# Patient Record
Sex: Male | Born: 1955 | Hispanic: No | Marital: Married
Health system: Southern US, Community
[De-identification: ages and names within clinical notes are randomized; demographics above are authoritative.]

## PROBLEM LIST (undated history)

## (undated) DIAGNOSIS — J449 Chronic obstructive pulmonary disease, unspecified: Secondary | ICD-10-CM

## (undated) DIAGNOSIS — J9621 Acute and chronic respiratory failure with hypoxia: Secondary | ICD-10-CM

## (undated) DIAGNOSIS — N183 Chronic kidney disease, stage 3 unspecified: Secondary | ICD-10-CM

## (undated) DIAGNOSIS — J80 Acute respiratory distress syndrome: Secondary | ICD-10-CM

## (undated) DIAGNOSIS — U071 COVID-19: Secondary | ICD-10-CM

---

## 2019-11-26 ENCOUNTER — Other Ambulatory Visit (HOSPITAL_COMMUNITY): Payer: Medicare Other

## 2019-11-26 ENCOUNTER — Inpatient Hospital Stay
Admission: EM | Admit: 2019-11-26 | Discharge: 2019-12-30 | Disposition: A | Discharge status: Skilled Nursing Facility | Payer: Medicare Other | Source: Other Acute Inpatient Hospital | Attending: Internal Medicine | Admitting: Internal Medicine

## 2019-11-26 DIAGNOSIS — J80 Acute respiratory distress syndrome: Secondary | ICD-10-CM | POA: Diagnosis present

## 2019-11-26 DIAGNOSIS — J189 Pneumonia, unspecified organism: Secondary | ICD-10-CM

## 2019-11-26 DIAGNOSIS — U071 COVID-19: Secondary | ICD-10-CM | POA: Diagnosis present

## 2019-11-26 DIAGNOSIS — N183 Chronic kidney disease, stage 3 unspecified: Secondary | ICD-10-CM | POA: Diagnosis present

## 2019-11-26 DIAGNOSIS — Z931 Gastrostomy status: Secondary | ICD-10-CM

## 2019-11-26 DIAGNOSIS — J9690 Respiratory failure, unspecified, unspecified whether with hypoxia or hypercapnia: Secondary | ICD-10-CM

## 2019-11-26 DIAGNOSIS — T17908A Unspecified foreign body in respiratory tract, part unspecified causing other injury, initial encounter: Secondary | ICD-10-CM

## 2019-11-26 DIAGNOSIS — N179 Acute kidney failure, unspecified: Secondary | ICD-10-CM

## 2019-11-26 DIAGNOSIS — J9621 Acute and chronic respiratory failure with hypoxia: Secondary | ICD-10-CM | POA: Diagnosis present

## 2019-11-26 DIAGNOSIS — K567 Ileus, unspecified: Secondary | ICD-10-CM

## 2019-11-26 DIAGNOSIS — J449 Chronic obstructive pulmonary disease, unspecified: Secondary | ICD-10-CM | POA: Diagnosis present

## 2019-11-26 HISTORY — DX: COVID-19: U07.1

## 2019-11-26 HISTORY — DX: Chronic kidney disease, stage 3 unspecified: N18.30

## 2019-11-26 HISTORY — DX: Acute and chronic respiratory failure with hypoxia: J96.21

## 2019-11-26 HISTORY — DX: Chronic obstructive pulmonary disease, unspecified: J44.9

## 2019-11-26 HISTORY — DX: Acute respiratory distress syndrome: J80

## 2019-11-27 ENCOUNTER — Encounter: Payer: Self-pay | Admitting: Internal Medicine

## 2019-11-27 DIAGNOSIS — J9621 Acute and chronic respiratory failure with hypoxia: Secondary | ICD-10-CM | POA: Diagnosis not present

## 2019-11-27 DIAGNOSIS — U071 COVID-19: Secondary | ICD-10-CM

## 2019-11-27 DIAGNOSIS — J449 Chronic obstructive pulmonary disease, unspecified: Secondary | ICD-10-CM | POA: Diagnosis not present

## 2019-11-27 DIAGNOSIS — J80 Acute respiratory distress syndrome: Secondary | ICD-10-CM

## 2019-11-27 DIAGNOSIS — N183 Chronic kidney disease, stage 3 unspecified: Secondary | ICD-10-CM | POA: Diagnosis not present

## 2019-11-27 LAB — HEMOGLOBIN A1C
Hgb A1c MFr Bld: 6.3 % — ABNORMAL HIGH (ref 4.8–5.6)
Mean Plasma Glucose: 134.11 mg/dL

## 2019-11-27 LAB — URINALYSIS, ROUTINE W REFLEX MICROSCOPIC
Bilirubin Urine: NEGATIVE
Glucose, UA: NEGATIVE mg/dL
Ketones, ur: NEGATIVE mg/dL
Leukocytes,Ua: NEGATIVE
Nitrite: NEGATIVE
Protein, ur: 30 mg/dL — AB
Specific Gravity, Urine: 1.021 (ref 1.005–1.030)
pH: 5 (ref 5.0–8.0)

## 2019-11-27 LAB — CBC WITH DIFFERENTIAL/PLATELET
Abs Immature Granulocytes: 0.08 10*3/uL — ABNORMAL HIGH (ref 0.00–0.07)
Basophils Absolute: 0 10*3/uL (ref 0.0–0.1)
Basophils Relative: 0 %
Eosinophils Absolute: 0.1 10*3/uL (ref 0.0–0.5)
Eosinophils Relative: 1 %
HCT: 40.5 % (ref 39.0–52.0)
Hemoglobin: 12.8 g/dL — ABNORMAL LOW (ref 13.0–17.0)
Immature Granulocytes: 1 %
Lymphocytes Relative: 10 %
Lymphs Abs: 1.2 10*3/uL (ref 0.7–4.0)
MCH: 28.6 pg (ref 26.0–34.0)
MCHC: 31.6 g/dL (ref 30.0–36.0)
MCV: 90.6 fL (ref 80.0–100.0)
Monocytes Absolute: 1.4 10*3/uL — ABNORMAL HIGH (ref 0.1–1.0)
Monocytes Relative: 13 %
Neutro Abs: 8.5 10*3/uL — ABNORMAL HIGH (ref 1.7–7.7)
Neutrophils Relative %: 75 %
Platelets: 368 10*3/uL (ref 150–400)
RBC: 4.47 MIL/uL (ref 4.22–5.81)
RDW: 17.1 % — ABNORMAL HIGH (ref 11.5–15.5)
WBC: 11.4 10*3/uL — ABNORMAL HIGH (ref 4.0–10.5)
nRBC: 0 % (ref 0.0–0.2)

## 2019-11-27 LAB — COMPREHENSIVE METABOLIC PANEL
ALT: 65 U/L — ABNORMAL HIGH (ref 0–44)
AST: 44 U/L — ABNORMAL HIGH (ref 15–41)
Albumin: 2.5 g/dL — ABNORMAL LOW (ref 3.5–5.0)
Alkaline Phosphatase: 62 U/L (ref 38–126)
Anion gap: 11 (ref 5–15)
BUN: 24 mg/dL — ABNORMAL HIGH (ref 8–23)
CO2: 22 mmol/L (ref 22–32)
Calcium: 8.9 mg/dL (ref 8.9–10.3)
Chloride: 116 mmol/L — ABNORMAL HIGH (ref 98–111)
Creatinine, Ser: 1.06 mg/dL (ref 0.61–1.24)
GFR calc Af Amer: >60 mL/min (ref >60)
GFR calc non Af Amer: >60 mL/min (ref >60)
Glucose, Bld: 134 mg/dL — ABNORMAL HIGH (ref 70–99)
Potassium: 4.4 mmol/L (ref 3.5–5.1)
Sodium: 149 mmol/L — ABNORMAL HIGH (ref 135–145)
Total Bilirubin: 0.8 mg/dL (ref 0.3–1.2)
Total Protein: 6.5 g/dL (ref 6.5–8.1)

## 2019-11-27 LAB — BLOOD GAS, ARTERIAL
Acid-base deficit: 3.5 mmol/L — ABNORMAL HIGH (ref 0.0–2.0)
Bicarbonate: 20.5 mmol/L (ref 20.0–28.0)
FIO2: 36
O2 Saturation: 96.7 %
Patient temperature: 38.7
pCO2 arterial: 36.9 mmHg (ref 32.0–48.0)
pH, Arterial: 7.372 (ref 7.350–7.450)
pO2, Arterial: 107 mmHg (ref 83.0–108.0)

## 2019-11-27 MED ORDER — LIDOCAINE HCL 2 % EX GEL
10.00 | CUTANEOUS | Status: DC
Start: ? — End: 2019-11-27

## 2019-11-27 MED ORDER — ACETAMINOPHEN 325 MG PO TABS
650.00 | ORAL_TABLET | ORAL | Status: DC
Start: ? — End: 2019-11-27

## 2019-11-27 MED ORDER — PROSOURCE TF PO LIQD
90.00 | ORAL | Status: DC
Start: 2019-11-26 — End: 2019-11-27

## 2019-11-27 MED ORDER — MAGNESIUM SULFATE 4 GM/100ML IV SOLN
4.00 | INTRAVENOUS | Status: DC
Start: ? — End: 2019-11-27

## 2019-11-27 MED ORDER — GENERIC EXTERNAL MEDICATION
1.00 | Status: DC
Start: ? — End: 2019-11-27

## 2019-11-27 MED ORDER — ONDANSETRON HCL 4 MG/2ML IJ SOLN
4.00 | INTRAMUSCULAR | Status: DC
Start: ? — End: 2019-11-27

## 2019-11-27 MED ORDER — LORAZEPAM 2 MG/ML IJ SOLN
1.00 | INTRAMUSCULAR | Status: DC
Start: ? — End: 2019-11-27

## 2019-11-27 MED ORDER — GENERIC EXTERNAL MEDICATION
Status: DC
Start: ? — End: 2019-11-27

## 2019-11-27 MED ORDER — ATROPINE SULFATE 0.5 MG/5ML IJ SOSY
0.50 | PREFILLED_SYRINGE | INTRAMUSCULAR | Status: DC
Start: ? — End: 2019-11-27

## 2019-11-27 MED ORDER — EPINEPHRINE 1 MG/10ML IJ SOSY
1.00 | PREFILLED_SYRINGE | INTRAMUSCULAR | Status: DC
Start: ? — End: 2019-11-27

## 2019-11-27 MED ORDER — GENERIC EXTERNAL MEDICATION
5.00 | Status: DC
Start: ? — End: 2019-11-27

## 2019-11-27 MED ORDER — INSULIN ASPART 100 UNIT/ML FLEXPEN
0.00 | PEN_INJECTOR | SUBCUTANEOUS | Status: DC
Start: 2019-11-26 — End: 2019-11-27

## 2019-11-27 MED ORDER — IPRATROPIUM-ALBUTEROL 0.5-2.5 (3) MG/3ML IN SOLN
3.00 | RESPIRATORY_TRACT | Status: DC
Start: ? — End: 2019-11-27

## 2019-11-27 MED ORDER — FUROSEMIDE 10 MG/ML IJ SOLN
20.00 | INTRAMUSCULAR | Status: DC
Start: 2019-11-27 — End: 2019-11-27

## 2019-11-27 MED ORDER — SODIUM CHLORIDE 0.9 % IV SOLN
INTRAVENOUS | Status: DC
Start: ? — End: 2019-11-27

## 2019-11-27 MED ORDER — FENTANYL CITRATE (PF) 50 MCG/ML IJ SOLN
50.00 | INTRAMUSCULAR | Status: DC
Start: ? — End: 2019-11-27

## 2019-11-27 MED ORDER — L-LYSINE EX OINT
TOPICAL_OINTMENT | CUTANEOUS | Status: DC
Start: ? — End: 2019-11-27

## 2019-11-27 MED ORDER — NALOXONE HCL 0.4 MG/ML IJ SOLN
0.04 | INTRAMUSCULAR | Status: DC
Start: ? — End: 2019-11-27

## 2019-11-27 MED ORDER — ZIPRASIDONE HCL 20 MG PO CAPS
20.00 | ORAL_CAPSULE | ORAL | Status: DC
Start: 2019-11-26 — End: 2019-11-27

## 2019-11-27 MED ORDER — FAMOTIDINE 20 MG/2ML IV SOLN
20.00 | INTRAVENOUS | Status: DC
Start: 2019-11-26 — End: 2019-11-27

## 2019-11-27 MED ORDER — CARBOXYMETHYLCELLULOSE SODIUM 0.5 % OP SOLN
2.00 | OPHTHALMIC | Status: DC
Start: 2019-11-26 — End: 2019-11-27

## 2019-11-27 MED ORDER — RA PROBIOTIC DIGESTIVE CARE PO CAPS
1.00 | ORAL_CAPSULE | ORAL | Status: DC
Start: 2019-11-26 — End: 2019-11-27

## 2019-11-27 MED ORDER — LACTATED RINGERS IV SOLN
500.00 | INTRAVENOUS | Status: DC
Start: ? — End: 2019-11-27

## 2019-11-27 MED ORDER — ERGOCALCIFEROL 200 MCG/ML PO SOLN
1000.00 | ORAL | Status: DC
Start: 2019-11-27 — End: 2019-11-27

## 2019-11-27 MED ORDER — BIAFINE EX EMUL
CUTANEOUS | Status: DC
Start: 2019-11-26 — End: 2019-11-27

## 2019-11-27 MED ORDER — ENOXAPARIN SODIUM 300 MG/3ML IJ SOLN
1.00 | INTRAMUSCULAR | Status: DC
Start: 2019-11-26 — End: 2019-11-27

## 2019-11-27 MED ORDER — GENERIC EXTERNAL MEDICATION
4000.00 | Status: DC
Start: ? — End: 2019-11-27

## 2019-11-27 MED ORDER — GLUCOSE-VITAMIN C 4-6 GM-MG PO CHEW
12.00 | CHEWABLE_TABLET | ORAL | Status: DC
Start: ? — End: 2019-11-27

## 2019-11-27 MED ORDER — DEXTROSE 50 % IV SOLN
25.00 | INTRAVENOUS | Status: DC
Start: ? — End: 2019-11-27

## 2019-11-27 MED ORDER — LABETALOL HCL 5 MG/ML IV SOLN
10.00 | INTRAVENOUS | Status: DC
Start: ? — End: 2019-11-27

## 2019-11-27 MED ORDER — INSULIN GLARGINE 100 UNIT/ML SOLOSTAR PEN
15.00 | PEN_INJECTOR | SUBCUTANEOUS | Status: DC
Start: 2019-11-26 — End: 2019-11-27

## 2019-11-27 MED ORDER — NYSTATIN 100000 UNIT/GM EX POWD
CUTANEOUS | Status: DC
Start: 2019-11-26 — End: 2019-11-27

## 2019-11-27 NOTE — Consult Note (Signed)
Currently was off the ventilatorPulmonary Critical Care Medicine [facility] [place]  PULMONARY SERVICE  Date of Service: 11/27/2019  PULMONARY CRITICAL CARE CONSULT   Charles Peters.  HKV:[id number]  DOB: 01-24-1956   DOA: 11/26/2019  Referring Physician: Carron Curie, MD  HPI: Charles Peters. is a 63 y.o. male seen for follow up of Acute on Chronic Respiratory Failure.  Patient has multiple medical problems including COPD coronary artery disease chronic pain GERD chronic bronchitis obstructive sleep apnea myocardial infarction pneumonia who was admitted to the hospital because of increasing shortness of breath.  Patient apparently presented to the ED with a 1 week history of shortness of breath was found to be exposed to a family member who was Covid positive.  There was some myalgias and low-grade fevers noted.  Patient on presentation had a chest x-ray which revealed bilateral infiltrates with significantly hypoxic but was started on BiPAP with some gradual improvement subsequently the patient did decompensate and ended up having to be intubated on the ventilator.  Review of Systems:  ROS performed and is unremarkable other than noted above.  Past Medical History:  Diagnosis Date  . Arthritis  osteo  . CAD (coronary artery disease)  Managed by Dr. Antonieta Loveless [facility]  . Chronic pain  . COPD (chronic obstructive pulmonary disease) (HCC)  Managed by Dr. Nadara Eaton [facility]  . CRI (chronic renal insufficiency)  Stage 3, not on dialysis, Managed by Dr. Clydene Fake [facility]ph[facility]  . Diabetes mellitus (HCC) 2015  Type 2, diet controlled  . Enlarged prostate  . GERD (gastroesophageal reflux disease)  . Hx of cardiac murmur  . Hx of cardiovascular stress test  . Hx of chronic bronchitis  uses inhalers  . Hyperlipidemia  . Hypertension  . MI (myocardial infarction) (HCC) 10/2006  . MRSA (methicillin  resistant Staphylococcus aureus)  . Myocardial infarct (HCC) 2007  . Obstructive sleep apnea  USES CPAP + OXYGEN 2L [place]  . Parathyroid abnormality (HCC)  "readings are high"  . PUD (peptic ulcer disease)  . Thromboembolism (HCC) 1987  DVT left leg, not on coumadin   Past Surgical History:  Procedure Laterality Date  . HX ADENOIDECTOMY  . HX CAROTID ENDARTERECTOMY Right  . HX CARPAL TUNNEL RELEASE Bilateral  . HX CHOLECYSTECTOMY  . HX COLONOSCOPY  . HX CORONARY ARTERY BYPASS GRAFT 2007  triple bypass  . HX HERNIA REPAIR  umbilical  . HX HIP REPLACEMENT Right 07/26/2016  . HX HIP REPLACEMENT Left 11/15/2016  . HX KNEE ARTHROSCOPY Left  x 3  . HX MULTIPLE TOOTH EXTRACTIONS  . HX NERVE TRANSPORTATION Bilateral  ulnar  . HX NISSEN FUNDOPLICATION  . HX POLYPECTOMY 07/31/2019  bladder  . HX PTCA 2008  X 3  . HX TONSILLECTOMY  . HX ULNAR NERVE TRANSPOSITION Bilateral  . HX UPPER ENDOSCOPY  . PR ARTHRODESIS METACARPOPHALANGEAL JT W/WO INT FIXJ Right 09/18/2014  METATARSOPHALANGEAL JOINT FUSION performed by Morton Amy., MD at [facility]  . PR ARTHRODESIS MTCRPL JT W/WO INT FIXJ W/AUTOGRAFT Right 05/25/2015  RIGHT NONUNION REPAIR INDEX MCD FUSION W/ DISTAL RADIUS BONE GRAFT (GENERAL W/ BLOCK) performed by Morton Amy., MD at [facility]  . PR EXC LESION TDN SHTH/JT CAPSL HAND/FNGR Right 09/18/2014  FINGER(S) MUCOUS CYST EXCISION THUMB performed by Morton Amy., MD at [facility]  . PR TENDON SHEATH INCISION Right 09/18/2014  TRIGGER FINGER RELEASE INDEX MCP FUSION / WITH INJECTION FINGER JOINT  performed by Morton AmyWoodside, Julie C., [facility]   Family History  Problem Relation Name Age of Onset  . Heart Disease Father  . Heart Disease Mother  . Diabetes Neg Hx   Social History   Socioeconomic History  . Marital status: Married  Spouse name: Not on file  . Number of children: Not on file  . Years of education: Not on file  . Highest education level: Not on file   Occupational History  . Not on file  Social Needs  . Financial resource strain: Not on file  . Food insecurity  Worry: Not on file  Inability: Not on file  . Transportation needs  Medical: Not on file  Non-medical: Not on file  Tobacco Use  . Smoking status: Former Smoker    Medications: Reviewed on Rounds  Physical Exam:  Vitals: Temperature 101.6 pulse 145 respiratory rate 42 blood pressure 157/81 saturations 99%  Ventilator Settings on 3 L oxygen with a nag device in place  . General: Comfortable at this time . Eyes: Grossly normal lids, irises & conjunctiva . ENT: grossly tongue is normal . Neck: no obvious mass . Cardiovascular: S1-S2 normal no gallop or rub is noted . Respiratory: No rhonchi coarse breath sounds . Abdomen: Soft and nontender . Skin: no rash seen on limited exam . Musculoskeletal: not rigid . Psychiatric:unable to assess . Neurologic: no seizure no involuntary movements         Labs on Admission:  Basic Metabolic Panel: Recent Labs  Lab 11/27/19 0519  NA 149*  K 4.4  CL 116*  CO2 22  GLUCOSE 134*  BUN 24*  CREATININE 1.06  CALCIUM 8.9    Recent Labs  Lab 11/27/19 0550  PHART 7.372  PCO2ART 36.9  PO2ART 107  HCO3 20.5  O2SAT 96.7    Liver Function Tests: Recent Labs  Lab 11/27/19 0519  AST 44*  ALT 65*  ALKPHOS 62  BILITOT 0.8  PROT 6.5  ALBUMIN 2.5*   No results for input(s): LIPASE, AMYLASE in the last 168 hours. No results for input(s): AMMONIA in the last 168 hours.  CBC: Recent Labs  Lab 11/27/19 0519  WBC 11.4*  NEUTROABS 8.5*  HGB 12.8*  HCT 40.5  MCV 90.6  PLT 368    Cardiac Enzymes: No results for input(s): CKTOTAL, CKMB, CKMBINDEX, TROPONINI in the last 168 hours.  BNP (last 3 results) No results for input(s): BNP in the last 8760 hours.  ProBNP (last 3 results) No results for input(s): PROBNP in the last 8760 hours.   Radiological Exams on Admission: DG Abd 1 View  Result Date:  11/26/2019 CLINICAL DATA:  Peg tube check EXAM: ABDOMEN - 1 VIEW COMPARISON:  None. FINDINGS: Contrast medium is present in the percutaneous gastrostomy tube and in the stomach and descending duodenum. No abnormal leakage of contrast around the tube. Gas is present in colon and small bowel. IMPRESSION: 1. The PEG tube is located in the stomach. The stomach and descending duodenum are opacified with contrast. Electronically Signed   By: Gaylyn RongWalter  Liebkemann M.D.   On: 11/26/2019 19:34   DG Chest Port 1 View  Result Date: 11/26/2019 CLINICAL DATA:  ARDS EXAM: PORTABLE CHEST 1 VIEW COMPARISON:  None. FINDINGS: Tracheostomy tube projects over the tracheal air shadow. Reverse lordotic projection. Prior median sternotomy. Low lung volumes are present, causing crowding of the pulmonary vasculature. Equivocal interstitial accentuation at the left lung base but no discrete airspace opacity. No definite blunting of  the costophrenic angles. Left heart border indistinct, borderline appearance for cardiomegaly. Right central line tip: SVC. IMPRESSION: 1. Equivocal interstitial accentuation at the left lung base. 2. Tracheostomy tube appears satisfactorily positioned. 3. Borderline cardiomegaly. 4. Low lung volumes. 5. Prior median sternotomy. 6. Right central line tip: SVC. Electronically Signed   By: Van Clines M.D.   On: 11/26/2019 19:33    Assessment/Plan Active Problems:   Acute on chronic respiratory failure with hypoxia (HCC)   COVID-19 virus infection   Acute respiratory distress syndrome (ARDS) due to 2019 novel coronavirus (HCC)   COPD, severe (HCC)   Chronic kidney disease, stage III (moderate)   1. Acute on chronic respiratory failure with anoxia patient right now is rather tachypneic also is febrile I am concerned about his respiratory rate that is in the 40s along with the fever.  Patient also is noted to be significantly tachycardic.  The chest x-ray that was done shows some cardiomegaly low  lung volumes and some increased interstitial markings which could be consistent as a result of the Covid infection.  Patient did have ARDS at the other facility and this could represent some resolution of that. 2. COVID-19 virus infection patient comes to [place] still with the is in resolution phase.  Spoke with the primary team regarding ongoing isolation. 3. ARDS secondary to COVID-19 still has significant increased work of breathing patient may actually need to be placed back on the ventilator we will see how he does for the remainder of the day. 4. COPD nebulizers as necessary we will continue to monitor closely. 5. Stage III chronic kidney disease follow-up labs closely.  I have personally seen and evaluated the patient, evaluated laboratory and imaging results, formulated the assessment and plan and placed orders.  Patient is critically ill in danger of cardiac arrest and death may need to be placed back on ventilator The Patient requires high complexity decision making for assessment and support.  Case was discussed on Rounds with the Respiratory Therapy Staff Time Spent 62minutes  Allyne Gee, MD [facility] Pulmonary Critical Care Medicine Sleep Medicine

## 2019-11-28 ENCOUNTER — Other Ambulatory Visit (HOSPITAL_COMMUNITY): Payer: Medicare Other

## 2019-11-28 DIAGNOSIS — N183 Chronic kidney disease, stage 3 unspecified: Secondary | ICD-10-CM | POA: Diagnosis not present

## 2019-11-28 DIAGNOSIS — U071 COVID-19: Secondary | ICD-10-CM | POA: Diagnosis not present

## 2019-11-28 DIAGNOSIS — J9621 Acute and chronic respiratory failure with hypoxia: Secondary | ICD-10-CM | POA: Diagnosis not present

## 2019-11-28 DIAGNOSIS — J449 Chronic obstructive pulmonary disease, unspecified: Secondary | ICD-10-CM | POA: Diagnosis not present

## 2019-11-28 LAB — URINE CULTURE
Culture: NO GROWTH
Special Requests: NORMAL

## 2019-11-28 LAB — BLOOD GAS, ARTERIAL
Acid-base deficit: 5.4 mmol/L — ABNORMAL HIGH (ref 0.0–2.0)
Bicarbonate: 18.2 mmol/L — ABNORMAL LOW (ref 20.0–28.0)
FIO2: 28
O2 Saturation: 96.9 %
Patient temperature: 36.6
pCO2 arterial: 27.7 mmHg — ABNORMAL LOW (ref 32.0–48.0)
pH, Arterial: 7.431 (ref 7.350–7.450)
pO2, Arterial: 90.7 mmHg (ref 83.0–108.0)

## 2019-11-28 MED ORDER — GENERIC EXTERNAL MEDICATION
Status: DC
Start: ? — End: 2019-11-28

## 2019-11-28 NOTE — Progress Notes (Signed)
Pulmonary Critical Care Medicine [place]   PULMONARY CRITICAL CARE SERVICE  PROGRESS NOTE  Date of Service: 11/28/2019  Charles Peters.  TDV:[id number]  DOB: 1956-08-04   DOA: 11/26/2019  Referring Physician: Merton Border, MD  HPI: Charles Peters. is a 63 y.o. male seen for follow up of Acute on Chronic Respiratory Failure.  Patient currently is back on the ventilator had to be placed back on the vent yesterday now is on 28% FiO2 on pressure support mode  Medications: Reviewed on Rounds  Physical Exam:  Vitals: Temperature 99.3 pulse 130 respiratory 29 blood pressure 167/86 saturations 98%  Ventilator Settings mode ventilation pressure support FiO2 28% per support 12 PEEP 5  . General: Comfortable at this time . Eyes: Grossly normal lids, irises & conjunctiva . ENT: grossly tongue is normal . Neck: no obvious mass . Cardiovascular: S1 S2 normal no gallop . Respiratory: No rhonchi coarse breath sounds are noted at this time . Abdomen: soft . Skin: no rash seen on limited exam . Musculoskeletal: not rigid . Psychiatric:unable to assess . Neurologic: no seizure no involuntary movements         Lab Data:   Basic Metabolic Panel: Recent Labs  Lab 11/27/19 0519  NA 149*  K 4.4  CL 116*  CO2 22  GLUCOSE 134*  BUN 24*  CREATININE 1.06  CALCIUM 8.9    ABG: Recent Labs  Lab 11/27/19 0550 11/28/19 0537  PHART 7.372 7.431  PCO2ART 36.9 27.7*  PO2ART 107 90.7  HCO3 20.5 18.2*  O2SAT 96.7 96.9    Liver Function Tests: Recent Labs  Lab 11/27/19 0519  AST 44*  ALT 65*  ALKPHOS 62  BILITOT 0.8  PROT 6.5  ALBUMIN 2.5*   No results for input(s): LIPASE, AMYLASE in the last 168 hours. No results for input(s): AMMONIA in the last 168 hours.  CBC: Recent Labs  Lab 11/27/19 0519  WBC 11.4*  NEUTROABS 8.5*  HGB 12.8*  HCT 40.5  MCV 90.6  PLT 368    Cardiac Enzymes: No results for input(s): CKTOTAL, CKMB, CKMBINDEX,  TROPONINI in the last 168 hours.  BNP (last 3 results) No results for input(s): BNP in the last 8760 hours.  ProBNP (last 3 results) No results for input(s): PROBNP in the last 8760 hours.  Radiological Exams: DG Abd 1 View  Result Date: 11/26/2019 CLINICAL DATA:  Peg tube check EXAM: ABDOMEN - 1 VIEW COMPARISON:  None. FINDINGS: Contrast medium is present in the percutaneous gastrostomy tube and in the stomach and descending duodenum. No abnormal leakage of contrast around the tube. Gas is present in colon and small bowel. IMPRESSION: 1. The PEG tube is located in the stomach. The stomach and descending duodenum are opacified with contrast. Electronically Signed   By: Van Clines M.D.   On: 11/26/2019 19:34   DG CHEST PORT 1 VIEW  Result Date: 11/28/2019 CLINICAL DATA:  Respiratory failure. EXAM: PORTABLE CHEST 1 VIEW COMPARISON:  11/26/2019 FINDINGS: The tracheostomy tube is stable. The right PICC line has been removed. Stable borderline cardiac size. Persistent diffuse interstitial process but no focal airspace consolidation or pleural effusions. IMPRESSION: Interval removal of the right PICC line. The tracheostomy tube is stable. Persistent interstitial process but no infiltrates or effusions. Electronically Signed   By: Marijo Sanes M.D.   On: 11/28/2019 06:29   DG Chest Port 1 View  Result Date: 11/26/2019 CLINICAL DATA:  ARDS EXAM: PORTABLE CHEST 1 VIEW COMPARISON:  None.  FINDINGS: Tracheostomy tube projects over the tracheal air shadow. Reverse lordotic projection. Prior median sternotomy. Low lung volumes are present, causing crowding of the pulmonary vasculature. Equivocal interstitial accentuation at the left lung base but no discrete airspace opacity. No definite blunting of the costophrenic angles. Left heart border indistinct, borderline appearance for cardiomegaly. Right central line tip: SVC. IMPRESSION: 1. Equivocal interstitial accentuation at the left lung base. 2.  Tracheostomy tube appears satisfactorily positioned. 3. Borderline cardiomegaly. 4. Low lung volumes. 5. Prior median sternotomy. 6. Right central line tip: SVC. Electronically Signed   By: Gaylyn Rong M.D.   On: 11/26/2019 19:33    Assessment/Plan Active Problems:   Acute on chronic respiratory failure with hypoxia (HCC)   COVID-19 virus infection   Acute respiratory distress syndrome (ARDS) due to 2019 novel coronavirus (HCC)   COPD, severe (HCC)   Chronic kidney disease, stage III (moderate)   1. Acute on chronic respiratory failure hypoxia the plan is to continue with pressure support mode will reassess again for weaning potential patient has stabilized now since back on the ventilator. 2. COVID-19 virus infection in resolution phase 3. ARDS treated we will continue with supportive care 4. Severe COPD at baseline continue present management 5. Chronic kidney disease stage III supportive care follow labs   I have personally seen and evaluated the patient, evaluated laboratory and imaging results, formulated the assessment and plan and placed orders. The Patient requires high complexity decision making for assessment and support.  Case was discussed on Rounds with the Respiratory Therapy Staff  Yevonne Pax, MD [facility] Pulmonary Critical Care Medicine Sleep Medicine

## 2019-11-28 NOTE — Consult Note (Signed)
Infectious Disease Consultation   Charles Peters.  TFT:[id number]  DOB: [birth year]  DOA: 11/26/2019  Requesting physician: Dr.Hijazi  Reason for consultation: Antibiotic recommendations   History of Present Illness: Patient currently has trach in place, confused, nonverbal.  Unable to obtain history from the patient.  Therefore obtained from medical records. Charles Peters. is an 63 y.o. male with history of coronary artery disease, COPD, chronic kidney disease, obstructive sleep apnea on CPAP, diabetes mellitus who was admitted to the acute hospital on 11/11/2019 when he presented to the emergency room with worsening shortness of breath for duration of 1 week.  Patient reportedly was exposed to Covid positive family member a week prior.  He was also noted to have low-grade fevers and myalgias.  In the emergency room patient was found to be hypoxemic he was placed on high FiO2 on BiPAP.  Chest x-ray showed bilateral infiltrates.  He also had acute on chronic renal failure with elevated creatinine.  He also had an elevated troponin.  His Covid test was positive.  Patient was intubated on 11/12/2019.  He was treated with remdesivir, convalescent plasma and Decadron.  He was also treated with broad-spectrum antimicrobial treatment with IV vancomycin, meropenem and azithromycin.  He had tracheostomy done on 11/21/2019.  He also had PEG tube placement done.  Due to his complex medical problems he was transferred to [facility].  After presentation here he was found to be tachycardic with heart rate in the 140s.  He also was found to have fever.  Started on empiric IV vancomycin.  There was also plan to start Zosyn but patient has allergy documented to penicillin.  He is currently on 28% FiO2.  Confused, not following any commands at this time.  He has a PICC line from the outside facility.   Review of Systems:  Unable to obtain review of systems at this time.   Past Medical History: Past  Medical History:  Diagnosis Date  . Acute on chronic respiratory failure with hypoxia (HCC)   . Acute respiratory distress syndrome (ARDS) due to 2019 novel coronavirus (HCC)   . Chronic kidney disease, stage III (moderate)   . COPD, severe (HCC)   . COVID-19 virus infection   . Arthritis  osteo  . CAD (coronary artery disease)  Managed by Dr. Antonieta Loveless [facility]  . Chronic pain  . COPD (chronic obstructive pulmonary disease) (HCC)  Managed by Dr. Nadara Eaton [facility]  . CRI (chronic renal insufficiency)  Stage 3, not on dialysis, Managed by Dr. Clydene Fake [facility]  . Diabetes mellitus (HCC) 2015  Type 2, diet controlled  . Enlarged prostate  . GERD (gastroesophageal reflux disease)  . Hx of cardiac murmur  . Hx of cardiovascular stress test  . Hx of chronic bronchitis  uses inhalers  . Hyperlipidemia  . Hypertension  . MI (myocardial infarction) (HCC) 10/2006  . MRSA (methicillin resistant Staphylococcus aureus)  . Myocardial infarct (HCC) 2007  . Obstructive sleep apnea  USES CPAP + OXYGEN 2L [place]  . Parathyroid abnormality (HCC)  "readings are high"  . PUD (peptic ulcer disease)  . Thromboembolism (HCC) 1987  DVT left leg, not on coumadin    Past Surgical History: . HX ADENOIDECTOMY  . HX CAROTID ENDARTERECTOMY Right  . HX CARPAL TUNNEL RELEASE Bilateral  . HX CHOLECYSTECTOMY  . HX COLONOSCOPY  . HX CORONARY ARTERY BYPASS GRAFT 2007  triple bypass  . HX HERNIA REPAIR  umbilical  . HX HIP REPLACEMENT Right 07/26/2016  . HX HIP REPLACEMENT Left 11/15/2016  . HX KNEE ARTHROSCOPY Left  x 3  . HX MULTIPLE TOOTH EXTRACTIONS  . HX NERVE TRANSPORTATION Bilateral  ulnar  . HX NISSEN FUNDOPLICATION  . HX POLYPECTOMY 07/31/2019  bladder  . HX PTCA 2008  X 3  . HX TONSILLECTOMY  . HX ULNAR NERVE TRANSPOSITION Bilateral  . HX UPPER ENDOSCOPY  . PR ARTHRODESIS METACARPOPHALANGEAL JT W/WO INT FIXJ  Right 09/18/2014  METATARSOPHALANGEAL JOINT FUSION performed [facility] Morton AmyWoodside, Julie C[facility]  . PR ARTHRODESIS MTCRPL JT W/WO INT FIXJ W/AUTOGRAFT Right 05/25/2015  RIGHT NONUNION REPAIR INDEX MCD FUSION W/ DISTAL RADIUS BONE GRAFT (GENERAL W/ BLOCK) [facility] Morton [facility]Woodside, [facility] C[facility]  . PR EXC LESION TDN SHTH/JT CAPSL HAND/FNGR Right 09/18/2014  FINGER(S) MUCOUS CYST EXCISION THUMB performed by Morton AmyWoodside, Julie C., [facility]  . PR TENDON SHEATH INCISION Right 09/18/2014  TRIGGER FINGER RELEASE INDEX MCP FUSION / WITH INJECTION FINGER JOINT     Allergies: . Aspirin Nausea and Vomiting  GI upset  . Lyrica [Pregabalin] Hallucination  . Metformin Other (See Comments)  Gi issues  . Ibuprofen Other (See Comments)  Stomach upset  . Metoprolol Tartrate Other (See Comments)  Severe tiredness  . Penicillins Rash    Social History: Tobacco Use  . Smoking status: Former Smoker  Packs/day: 3.00  Years: 13.00  Pack years: 39.00  Types: Cigarettes, Pipe  Quit date: 04/23/1986  Years since quitting: 33.5  . Smokeless tobacco: Never Used  Substance and Sexual Activity  . Alcohol use: No  . Drug use: No    Family History: Heart Disease Father  . Heart Disease Mother  . Diabetes Neg Hx     Physical Exam: Vitals: Temperature 101.5, pulse 133, respiratory rate 34, blood pressure 141/87, pulse oximetry 96% on 28% FiO2. Constitutional: Ill-appearing male, confused, not following any commands at this time Head: Atraumatic, normocephalic Eyes: PERLA, irises appear normal ENMT: external ears and nose appear normal, Lips appears normal Neck: Has trach in place CVS: S1-S2 clear, no murmur  Respiratory: Rhonchi, no wheezing Abdomen: soft, nondistended, positive bowel sounds, PEG tube in place Musculoskeletal: Lower extremity edema, right upper extremity PICC line Neuro: He is confused, not following commands.  Unable to do a neurologic exam at this time. Psych:  unable to assess Skin: no rashes  Data reviewed:  I have personally reviewed following labs and imaging studies Labs:  CBC: Recent Labs  Lab 11/27/19 0519  WBC 11.4*  NEUTROABS 8.5*  HGB 12.8*  HCT 40.5  MCV 90.6  PLT 368    Basic Metabolic Panel: Recent Labs  Lab 11/27/19 0519  NA 149*  K 4.4  CL 116*  CO2 22  GLUCOSE 134*  BUN 24*  CREATININE 1.06  CALCIUM 8.9   GFR CrCl cannot be calculated (Unknown ideal weight.). Liver Function Tests: Recent Labs  Lab 11/27/19 0519  AST 44*  ALT 65*  ALKPHOS 62  BILITOT 0.8  PROT 6.5  ALBUMIN 2.5*   No results for input(s): LIPASE, AMYLASE in the last 168 hours. No results for input(s): AMMONIA in the last 168 hours. Coagulation profile No results for input(s): INR, PROTIME in the last 168 hours.  Cardiac Enzymes: No results for input(s): CKTOTAL, CKMB, CKMBINDEX, TROPONINI in the last 168 hours. BNP: Invalid input(s): POCBNP CBG: No results for input(s): GLUCAP  in the last 168 hours. D-Dimer No results for input(s): DDIMER in the last 72 hours. Hgb A1c Recent Labs    11/27/19 0519  HGBA1C 6.3*   Lipid Profile No results for input(s): CHOL, HDL, LDLCALC, TRIG, CHOLHDL, LDLDIRECT in the last 72 hours. Thyroid function studies No results for input(s): TSH, T4TOTAL, T3FREE, THYROIDAB in the last 72 hours.  Invalid input(s): FREET3 Anemia work up No results for input(s): VITAMINB12, FOLATE, FERRITIN, TIBC, IRON, RETICCTPCT in the last 72 hours. Urinalysis    Component Value Date/Time   COLORURINE YELLOW 11/26/2019 2324   APPEARANCEUR HAZY (A) 11/26/2019 2324   LABSPEC 1.021 11/26/2019 2324   PHURINE 5.0 11/26/2019 2324   GLUCOSEU NEGATIVE 11/26/2019 2324   HGBUR SMALL (A) 11/26/2019 2324   BILIRUBINUR NEGATIVE 11/26/2019 2324   KETONESUR NEGATIVE 11/26/2019 2324   PROTEINUR 30 (A) 11/26/2019 2324   NITRITE NEGATIVE 11/26/2019 2324   LEUKOCYTESUR NEGATIVE 11/26/2019 2324     Microbiology Recent  Results (from the past 240 hour(s))  Culture, Urine     Status: None   Collection Time: 11/26/19 11:24 PM   Specimen: Urine, Catheterized  Result Value Ref Range Status   Specimen Description URINE, CATHETERIZED  Final   Special Requests Normal  Final   Culture   Final    NO GROWTH Performed at [facility], [address] [place], [place] [address]    Report Status 11/28/2019 FINAL  Final  Culture, blood (routine x 2)     Status: None (Preliminary result)   Collection Time: 11/26/19 11:59 PM   Specimen: BLOOD  Result Value Ref Range Status   Specimen Description BLOOD LEFT HAND  Final   Special Requests   Final    BOTTLES DRAWN AEROBIC AND ANAEROBIC Blood Culture results may not be optimal due to an inadequate volume of blood received in culture bottles   Culture   Final    NO GROWTH 1 DAY Performed at [facility], [address], [place], [place] [address]    Report Status PENDING  Incomplete  Culture, blood (routine x 2)     Status: None (Preliminary result)   Collection Time: 11/26/19 11:59 PM   Specimen: BLOOD  Result Value Ref Range Status   Specimen Description BLOOD LEFT ARM  Final   Special Requests   Final    BOTTLES DRAWN AEROBIC AND ANAEROBIC Blood Culture results may not be optimal due to an inadequate volume of blood received in culture bottles   Culture   Final    NO GROWTH 1 DAY Performed at [facility], [address], [place], [place] [address]    Report Status PENDING  Incomplete       Inpatient Medications:   Scheduled Meds: Continuous Infusions:   Radiological Exams on Admission: DG Abd 1 View  Result Date: 11/26/2019 CLINICAL DATA:  Peg tube check EXAM: ABDOMEN - 1 VIEW COMPARISON:  None. FINDINGS: Contrast medium is present in the percutaneous gastrostomy tube and in the stomach and descending duodenum. No abnormal leakage of contrast around the tube. Gas is present in colon and small bowel. IMPRESSION: 1. The PEG tube is  located in the stomach. The stomach and descending duodenum are opacified with contrast. Electronically Signed   By: Gaylyn Rong M.D.   On: 11/26/2019 19:34   DG CHEST PORT 1 VIEW  Result Date: 11/28/2019 CLINICAL DATA:  Respiratory failure. EXAM: PORTABLE CHEST 1 VIEW COMPARISON:  11/26/2019 FINDINGS: The tracheostomy tube  is stable. The right PICC line has been removed. Stable borderline cardiac size. Persistent diffuse interstitial process but no focal airspace consolidation or pleural effusions. IMPRESSION: Interval removal of the right PICC line. The tracheostomy tube is stable. Persistent interstitial process but no infiltrates or effusions. Electronically Signed   By: Rudie Meyer M.D.   On: 11/28/2019 06:29   DG Chest Port 1 View  Result Date: 11/26/2019 CLINICAL DATA:  ARDS EXAM: PORTABLE CHEST 1 VIEW COMPARISON:  None. FINDINGS: Tracheostomy tube projects over the tracheal air shadow. Reverse lordotic projection. Prior median sternotomy. Low lung volumes are present, causing crowding of the pulmonary vasculature. Equivocal interstitial accentuation at the left lung base but no discrete airspace opacity. No definite blunting of the costophrenic angles. Left heart border indistinct, borderline appearance for cardiomegaly. Right central line tip: SVC. IMPRESSION: 1. Equivocal interstitial accentuation at the left lung base. 2. Tracheostomy tube appears satisfactorily positioned. 3. Borderline cardiomegaly. 4. Low lung volumes. 5. Prior median sternotomy. 6. Right central line tip: SVC. Electronically Signed   By: Gaylyn Rong M.D.   On: 11/26/2019 19:33    Impression/Recommendations Active Problems:   Acute on chronic respiratory failure with hypoxia (HCC)   COVID-19 virus infection   Acute respiratory distress syndrome (ARDS) due to 2019 novel coronavirus (HCC)   COPD, severe (HCC)   Chronic kidney disease, stage III (moderate) Pneumonia Fever Acute renal  failure ARDS Left gluteal pressure ulcer unspecified stage Diabetes mellitus Morbid obesity Dysphagia  Acute on chronic respiratory failure with hypoxemia: Patient currently vent dependent, on 28% FiO2.  Multifactorial etiology.  He has COPD, COVID-19 infection with pneumonia, had ARDS at the outside facility. Chest x-ray showing low lung volumes, interstitial accentuation of the left lung.  Currently started on IV vancomycin.  He is continuing to have fevers.  Recommend to order respiratory cultures, blood cultures, change to Foley catheter and order UA with urine cultures.  Continue treatment with IV vancomycin for now.  If fevers not improving consider removing the PICC line.  Consider adding meropenem while we are awaiting the culture results.  Reportedly has allergy to penicillin, reported as rash.  However, the outside facility was treated with broad-spectrum antimicrobials with IV vancomycin, cefepime and seems to have tolerated the cephalosporins.  If his respiratory status is worsening would recommend to repeat chest imaging preferably chest CT without contrast to better evaluate.  COVID-19 infection: Patient was already treated with remdesivir, Decadron, convalescent plasma in the outside facility.  Continue supportive management per the primary team.  Pneumonia: He could possibly have a superimposed pneumonia.  He was already treated with broad-spectrum antimicrobials at the outside facility.  Currently on IV vancomycin.  Further antibiotics and plan as mentioned above.  Recommended cultures.  Will adjust antibiotics based on the culture results.  Pulmonary being consulted.  Fever: At this time exact etiology for the fever is unclear.  He had PICC line placed at the outside facility.  Recommend to remove the PICC line if feasible.  Cultures ordered.  Antibiotics as mentioned above.  Continue to monitor.  COPD with acute exacerbation: Current medication and management per primary  team.  Acute renal failure: Patient found to have acute renal failure with elevated BUN and creatinine at the outside facility.  Likely in the setting of infection, concern for sepsis.  Antibiotics renally dosed.  Continue to monitor BUN/trending closely.  Avoid nephrotoxic medications.  Further management of renal failure per the primary team.  Left gluteal pressure ulcer: Unspecified stage.  Continue local wound care.  Diabetes mellitus: Continue to monitor Accu-Cheks, medications for diabetes per primary team.  Obstructive sleep apnea/morbid obesity: Continue management per primary team.  Used to be on CPAP.  Pulmonary being consulted.  Dysphagia: Currently has a PEG tube in place.  Unfortunately due to his dysphagia he is very high risk for aspiration and worsening respiratory failure secondary to aspiration pneumonia.   Due to his complex medical problems he is very high risk for worsening and decompensation.  Thank you for this consultation.     Yaakov Guthrie M.D. 11/27/2019, 2:01 PM

## 2019-11-29 DIAGNOSIS — N183 Chronic kidney disease, stage 3 unspecified: Secondary | ICD-10-CM | POA: Diagnosis not present

## 2019-11-29 DIAGNOSIS — J9621 Acute and chronic respiratory failure with hypoxia: Secondary | ICD-10-CM | POA: Diagnosis not present

## 2019-11-29 DIAGNOSIS — U071 COVID-19: Secondary | ICD-10-CM | POA: Diagnosis not present

## 2019-11-29 DIAGNOSIS — J80 Acute respiratory distress syndrome: Secondary | ICD-10-CM | POA: Diagnosis not present

## 2019-11-29 LAB — VANCOMYCIN, TROUGH: Vancomycin Tr: 12 ug/mL — ABNORMAL LOW (ref 15–20)

## 2019-11-29 NOTE — Progress Notes (Signed)
Pulmonary Critical Care Medicine [place]   PULMONARY CRITICAL CARE SERVICE  PROGRESS NOTE  Date of Service: 11/29/2019  Charles Peters.  OFB:[id number]  DOB: [birth year]   DOA: 11/26/2019  Referring Physician: Merton Border, MD  HPI: Charles Peters. is a 63 y.o. male seen for follow up of Acute on Chronic Respiratory Failure.  Patient currently is on pressure support mode has been on 20% FiO2 12/5 right now is goal for 8 hours  Medications: Reviewed on Rounds  Physical Exam:  Vitals: Temperature 98.1 pulse 114 respiratory 26 blood pressure 167/79 saturations 98%  Ventilator Settings mode of ventilation pressure support FiO2 20% pressure poor 12 PEEP 5  . General: Comfortable at this time . Eyes: Grossly normal lids, irises & conjunctiva . ENT: grossly tongue is normal . Neck: no obvious mass . Cardiovascular: S1 S2 normal no gallop . Respiratory: No rhonchi no rales are noted at this time . Abdomen: soft . Skin: no rash seen on limited exam . Musculoskeletal: not rigid . Psychiatric:unable to assess . Neurologic: no seizure no involuntary movements         Lab Data:   Basic Metabolic Panel: Recent Labs  Lab 11/27/19 0519  NA 149*  K 4.4  CL 116*  CO2 22  GLUCOSE 134*  BUN 24*  CREATININE 1.06  CALCIUM 8.9    ABG: Recent Labs  Lab 11/27/19 0550 11/28/19 0537  PHART 7.372 7.431  PCO2ART 36.9 27.7*  PO2ART 107 90.7  HCO3 20.5 18.2*  O2SAT 96.7 96.9    Liver Function Tests: Recent Labs  Lab 11/27/19 0519  AST 44*  ALT 65*  ALKPHOS 62  BILITOT 0.8  PROT 6.5  ALBUMIN 2.5*   No results for input(s): LIPASE, AMYLASE in the last 168 hours. No results for input(s): AMMONIA in the last 168 hours.  CBC: Recent Labs  Lab 11/27/19 0519  WBC 11.4*  NEUTROABS 8.5*  HGB 12.8*  HCT 40.5  MCV 90.6  PLT 368    Cardiac Enzymes: No results for input(s): CKTOTAL, CKMB, CKMBINDEX, TROPONINI in the last 168 hours.  BNP  (last 3 results) No results for input(s): BNP in the last 8760 hours.  ProBNP (last 3 results) No results for input(s): PROBNP in the last 8760 hours.  Radiological Exams: DG CHEST PORT 1 VIEW  Result Date: 11/28/2019 CLINICAL DATA:  Respiratory failure. EXAM: PORTABLE CHEST 1 VIEW COMPARISON:  11/26/2019 FINDINGS: The tracheostomy tube is stable. The right PICC line has been removed. Stable borderline cardiac size. Persistent diffuse interstitial process but no focal airspace consolidation or pleural effusions. IMPRESSION: Interval removal of the right PICC line. The tracheostomy tube is stable. Persistent interstitial process but no infiltrates or effusions. Electronically Signed   By: Marijo Sanes M.D.   On: 11/28/2019 06:29    Assessment/Plan Active Problems:   Acute on chronic respiratory failure with hypoxia (HCC)   COVID-19 virus infection   Acute respiratory distress syndrome (ARDS) due to 2019 novel coronavirus (HCC)   COPD, severe (HCC)   Chronic kidney disease, stage III (moderate)   1. Acute on chronic respiratory failure with hypoxia plan is to continue weaning on pressure support goal of 8 hours 2. COVID-19 virus infection in resolution phase 3. Areas treated we will continue with supportive care 4. Severe COPD no baseline 5. Kidney disease stage III we will continue supportive care   I have personally seen and evaluated the patient, evaluated laboratory and imaging results, formulated the assessment  and plan and placed orders. The Patient requires high complexity decision making for assessment and support.  Case was discussed on Rounds with the Respiratory Therapy Staff  Allyne Gee, MD [facility] Pulmonary Critical Care Medicine Sleep Medicine

## 2019-11-30 ENCOUNTER — Other Ambulatory Visit (HOSPITAL_COMMUNITY): Payer: Medicare Other

## 2019-11-30 DIAGNOSIS — N183 Chronic kidney disease, stage 3 unspecified: Secondary | ICD-10-CM | POA: Diagnosis not present

## 2019-11-30 DIAGNOSIS — J80 Acute respiratory distress syndrome: Secondary | ICD-10-CM | POA: Diagnosis not present

## 2019-11-30 DIAGNOSIS — J9621 Acute and chronic respiratory failure with hypoxia: Secondary | ICD-10-CM | POA: Diagnosis not present

## 2019-11-30 DIAGNOSIS — U071 COVID-19: Secondary | ICD-10-CM | POA: Diagnosis not present

## 2019-11-30 LAB — BASIC METABOLIC PANEL
Anion gap: 10 (ref 5–15)
BUN: 31 mg/dL — ABNORMAL HIGH (ref 8–23)
CO2: 18 mmol/L — ABNORMAL LOW (ref 22–32)
Calcium: 9 mg/dL (ref 8.9–10.3)
Chloride: 111 mmol/L (ref 98–111)
Creatinine, Ser: 0.93 mg/dL (ref 0.61–1.24)
GFR calc Af Amer: >60 mL/min (ref >60)
GFR calc non Af Amer: >60 mL/min (ref >60)
Glucose, Bld: 208 mg/dL — ABNORMAL HIGH (ref 70–99)
Potassium: 4.9 mmol/L (ref 3.5–5.1)
Sodium: 139 mmol/L (ref 135–145)

## 2019-11-30 LAB — MAGNESIUM: Magnesium: 2.3 mg/dL (ref 1.7–2.4)

## 2019-11-30 MED ORDER — GENERIC EXTERNAL MEDICATION
Status: DC
Start: ? — End: 2019-11-30

## 2019-11-30 NOTE — Progress Notes (Signed)
Pulmonary Critical Care Medicine [place]   PULMONARY CRITICAL CARE SERVICE  PROGRESS NOTE  Date of Service: 11/30/2019  Charles Peters.  FBP:[id number]  DOB: 06/01/1956   DOA: 11/26/2019  Referring Physician: Merton Border, MD  HPI: Charles Peters. is a 63 y.o. male seen for follow up of Acute on Chronic Respiratory Failure.  Patient currently is on pressure support mode weaning goal is for 16 hours today  Medications: Reviewed on Rounds  Physical Exam:  Vitals: Temperature 98.4 pulse 117 respiratory 20 blood pressure 146/67 saturations 97%  Ventilator Settings mode ventilation pressure support FiO2 28% tidal volume 600 pressure poor 10 PEEP 5  . General: Comfortable at this time . Eyes: Grossly normal lids, irises & conjunctiva . ENT: grossly tongue is normal . Neck: no obvious mass . Cardiovascular: S1 S2 normal no gallop . Respiratory: No rhonchi no rales are noted at this time . Abdomen: soft . Skin: no rash seen on limited exam . Musculoskeletal: not rigid . Psychiatric:unable to assess . Neurologic: no seizure no involuntary movements         Lab Data:   Basic Metabolic Panel: Recent Labs  Lab 11/27/19 0519 11/30/19 1118  NA 149* 139  K 4.4 4.9  CL 116* 111  CO2 22 18*  GLUCOSE 134* 208*  BUN 24* 31*  CREATININE 1.06 0.93  CALCIUM 8.9 9.0  MG  --  2.3    ABG: Recent Labs  Lab 11/27/19 0550 11/28/19 0537  PHART 7.372 7.431  PCO2ART 36.9 27.7*  PO2ART 107 90.7  HCO3 20.5 18.2*  O2SAT 96.7 96.9    Liver Function Tests: Recent Labs  Lab 11/27/19 0519  AST 44*  ALT 65*  ALKPHOS 62  BILITOT 0.8  PROT 6.5  ALBUMIN 2.5*   No results for input(s): LIPASE, AMYLASE in the last 168 hours. No results for input(s): AMMONIA in the last 168 hours.  CBC: Recent Labs  Lab 11/27/19 0519  WBC 11.4*  NEUTROABS 8.5*  HGB 12.8*  HCT 40.5  MCV 90.6  PLT 368    Cardiac Enzymes: No results for input(s): CKTOTAL,  CKMB, CKMBINDEX, TROPONINI in the last 168 hours.  BNP (last 3 results) No results for input(s): BNP in the last 8760 hours.  ProBNP (last 3 results) No results for input(s): PROBNP in the last 8760 hours.  Radiological Exams: DG Abd Portable 1V  Result Date: 11/30/2019 CLINICAL DATA:  Assess bowel status. EXAM: PORTABLE ABDOMEN - 1 VIEW COMPARISON:  November 26, 2019 FINDINGS: Peg tube is identified projected over the stomach. Air is identified in both colon and small bowel loops. There is no evidence of small bowel obstruction. IMPRESSION: Peg tube is identified projected over the stomach. No evidence of small bowel obstruction. Electronically Signed   By: Abelardo Diesel M.D.   On: 11/30/2019 09:08    Assessment/Plan Active Problems:   Acute on chronic respiratory failure with hypoxia (HCC)   COVID-19 virus infection   Acute respiratory distress syndrome (ARDS) due to 2019 novel coronavirus (HCC)   COPD, severe (HCC)   Chronic kidney disease, stage III (moderate)   1. Acute on chronic respiratory failure hypoxia plan is to continue to wean on pressure to goal of 16 hours today 2. COVID-19 virus infection in resolution phase 3. ARDS improving we will continue to monitor closely 4. Severe COPD at baseline 5. Chronic kidney disease stage III controlled follow labs   I have personally seen and evaluated the patient, evaluated  laboratory and imaging results, formulated the assessment and plan and placed orders. The Patient requires high complexity decision making for assessment and support.  Case was discussed on Rounds with the Respiratory Therapy Staff  Allyne Gee, MD [facility] Pulmonary Critical Care Medicine Sleep Medicine

## 2019-12-01 DIAGNOSIS — N183 Chronic kidney disease, stage 3 unspecified: Secondary | ICD-10-CM | POA: Diagnosis not present

## 2019-12-01 DIAGNOSIS — U071 COVID-19: Secondary | ICD-10-CM | POA: Diagnosis not present

## 2019-12-01 DIAGNOSIS — J80 Acute respiratory distress syndrome: Secondary | ICD-10-CM | POA: Diagnosis not present

## 2019-12-01 DIAGNOSIS — J9621 Acute and chronic respiratory failure with hypoxia: Secondary | ICD-10-CM | POA: Diagnosis not present

## 2019-12-01 LAB — BASIC METABOLIC PANEL
Anion gap: 8 (ref 5–15)
BUN: 32 mg/dL — ABNORMAL HIGH (ref 8–23)
CO2: 18 mmol/L — ABNORMAL LOW (ref 22–32)
Calcium: 9 mg/dL (ref 8.9–10.3)
Chloride: 112 mmol/L — ABNORMAL HIGH (ref 98–111)
Creatinine, Ser: 0.95 mg/dL (ref 0.61–1.24)
GFR calc Af Amer: >60 mL/min (ref >60)
GFR calc non Af Amer: >60 mL/min (ref >60)
Glucose, Bld: 210 mg/dL — ABNORMAL HIGH (ref 70–99)
Potassium: 4.9 mmol/L (ref 3.5–5.1)
Sodium: 138 mmol/L (ref 135–145)

## 2019-12-01 LAB — VANCOMYCIN, TROUGH
Vancomycin Tr: 32 ug/mL (ref 15–20)
Vancomycin Tr: 16 ug/mL (ref 15–20)

## 2019-12-01 LAB — MAGNESIUM: Magnesium: 2.3 mg/dL (ref 1.7–2.4)

## 2019-12-01 NOTE — Progress Notes (Signed)
Pulmonary Critical Care Medicine [place]   PULMONARY CRITICAL CARE SERVICE  PROGRESS NOTE  Date of Service: 12/01/2019  Charles Peters.  [id number]:[id number]  DOB: [birth year]   DOA: 11/26/2019  Referring Physician: Merton Border, MD  HPI: Charles Peters. is a 63 y.o. male seen for follow up of Acute on Chronic Respiratory Failure.  Patient is on pressure support mode has been on 28% FiO2 with a goal of 16 hours  Medications: Reviewed on Rounds  Physical Exam:  Vitals: Temperature is 97.6 pulse 119 respiratory rate 22 blood pressure 155/76 saturations 97%  Ventilator Settings mode ventilation pressure support FiO2 28% per support 10 PEEP 5  . General: Comfortable at this time . Eyes: Grossly normal lids, irises & conjunctiva . ENT: grossly tongue is normal . Neck: no obvious mass . Cardiovascular: S1 S2 normal no gallop . Respiratory: No rhonchi coarse breath sounds are noted . Abdomen: soft . Skin: no rash seen on limited exam . Musculoskeletal: not rigid . Psychiatric:unable to assess . Neurologic: no seizure no involuntary movements         Lab Data:   Basic Metabolic Panel: Recent Labs  Lab 11/27/19 0519 11/30/19 1118 11/30/19 2351  NA 149* 139 138  K 4.4 4.9 4.9  CL 116* 111 112*  CO2 22 18* 18*  GLUCOSE 134* 208* 210*  BUN 24* 31* 32*  CREATININE 1.06 0.93 0.95  CALCIUM 8.9 9.0 9.0  MG  --  2.3 2.3    ABG: Recent Labs  Lab 11/27/19 0550 11/28/19 0537  PHART 7.372 7.431  PCO2ART 36.9 27.7*  PO2ART 107 90.7  HCO3 20.5 18.2*  O2SAT 96.7 96.9    Liver Function Tests: Recent Labs  Lab 11/27/19 0519  AST 44*  ALT 65*  ALKPHOS 62  BILITOT 0.8  PROT 6.5  ALBUMIN 2.5*   No results for input(s): LIPASE, AMYLASE in the last 168 hours. No results for input(s): AMMONIA in the last 168 hours.  CBC: Recent Labs  Lab 11/27/19 0519  WBC 11.4*  NEUTROABS 8.5*  HGB 12.8*  HCT 40.5  MCV 90.6  PLT 368    Cardiac  Enzymes: No results for input(s): CKTOTAL, CKMB, CKMBINDEX, TROPONINI in the last 168 hours.  BNP (last 3 results) No results for input(s): BNP in the last 8760 hours.  ProBNP (last 3 results) No results for input(s): PROBNP in the last 8760 hours.  Radiological Exams: DG Abd Portable 1V  Result Date: 11/30/2019 CLINICAL DATA:  Assess bowel status. EXAM: PORTABLE ABDOMEN - 1 VIEW COMPARISON:  November 26, 2019 FINDINGS: Peg tube is identified projected over the stomach. Air is identified in both colon and small bowel loops. There is no evidence of small bowel obstruction. IMPRESSION: Peg tube is identified projected over the stomach. No evidence of small bowel obstruction. Electronically Signed   By: Abelardo Diesel M.D.   On: 11/30/2019 09:08    Assessment/Plan Active Problems:   Acute on chronic respiratory failure with hypoxia (HCC)   COVID-19 virus infection   Acute respiratory distress syndrome (ARDS) due to 2019 novel coronavirus (HCC)   COPD, severe (HCC)   Chronic kidney disease, stage III (moderate)   1. Acute on chronic respiratory failure hypoxia plan is to continue with pressure support 10/5 with a goal of 16 hours 2. COVID-19 virus infection treated clinically is improving 3. ARDS treated improving but still with some residual changes on the chest x-ray 4. Severe COPD at baseline 5. Chronic kidney  disease we will continue to follow   I have personally seen and evaluated the patient, evaluated laboratory and imaging results, formulated the assessment and plan and placed orders. The Patient requires high complexity decision making for assessment and support.  Case was discussed on Rounds with the Respiratory Therapy Staff  Yevonne Pax, MD [facility] Pulmonary Critical Care Medicine Sleep Medicine

## 2019-12-02 ENCOUNTER — Other Ambulatory Visit (HOSPITAL_COMMUNITY): Payer: Medicare Other

## 2019-12-02 DIAGNOSIS — U071 COVID-19: Secondary | ICD-10-CM | POA: Diagnosis not present

## 2019-12-02 DIAGNOSIS — N183 Chronic kidney disease, stage 3 unspecified: Secondary | ICD-10-CM | POA: Diagnosis not present

## 2019-12-02 DIAGNOSIS — J9621 Acute and chronic respiratory failure with hypoxia: Secondary | ICD-10-CM | POA: Diagnosis not present

## 2019-12-02 DIAGNOSIS — J80 Acute respiratory distress syndrome: Secondary | ICD-10-CM | POA: Diagnosis not present

## 2019-12-02 LAB — URINALYSIS, ROUTINE W REFLEX MICROSCOPIC
Bilirubin Urine: NEGATIVE
Glucose, UA: NEGATIVE mg/dL
Ketones, ur: NEGATIVE mg/dL
Nitrite: NEGATIVE
Protein, ur: 30 mg/dL — AB
Specific Gravity, Urine: 1.018 (ref 1.005–1.030)
WBC, UA: >50 WBC/hpf — ABNORMAL HIGH (ref 0–5)
pH: 5 (ref 5.0–8.0)

## 2019-12-02 LAB — CBC
HCT: 36 % — ABNORMAL LOW (ref 39.0–52.0)
Hemoglobin: 11.7 g/dL — ABNORMAL LOW (ref 13.0–17.0)
MCH: 28.3 pg (ref 26.0–34.0)
MCHC: 32.5 g/dL (ref 30.0–36.0)
MCV: 87 fL (ref 80.0–100.0)
Platelets: 322 10*3/uL (ref 150–400)
RBC: 4.14 MIL/uL — ABNORMAL LOW (ref 4.22–5.81)
RDW: 16.3 % — ABNORMAL HIGH (ref 11.5–15.5)
WBC: 14 10*3/uL — ABNORMAL HIGH (ref 4.0–10.5)
nRBC: 0.9 % — ABNORMAL HIGH (ref 0.0–0.2)

## 2019-12-02 LAB — VANCOMYCIN, TROUGH: Vancomycin Tr: 23 ug/mL (ref 15–20)

## 2019-12-02 LAB — CULTURE, BLOOD (ROUTINE X 2)
Culture: NO GROWTH
Culture: NO GROWTH

## 2019-12-02 NOTE — Progress Notes (Addendum)
Pulmonary Critical Care Medicine [place]   PULMONARY CRITICAL CARE SERVICE  PROGRESS NOTE  Date of Service: 12/02/2019  Charles Peters.  ALP:[id number]  DOB: 1956/07/20   DOA: 11/26/2019  Referring Physician: Merton Border, MD  HPI: Charles Velardi. is a 63 y.o. male seen for follow up of Acute on Chronic Respiratory Failure.  Patient currently is on pressure support mode has been on 28% FiO2 12/5 right now the goal is 16-hour  Medications: Reviewed on Rounds  Physical Exam:  Vitals: Temperature 97.6 pulse 113 respiratory 21 blood pressure is 146/87 saturations 93%  Ventilator Settings mode ventilation pressure support FiO2 28% pressure 12 PEEP 5  . General: Comfortable at this time . Eyes: Grossly normal lids, irises & conjunctiva . ENT: grossly tongue is normal . Neck: no obvious mass . Cardiovascular: S1 S2 normal no gallop . Respiratory: No rhonchi no rales are noted at this time . Abdomen: soft . Skin: no rash seen on limited exam . Musculoskeletal: not rigid . Psychiatric:unable to assess . Neurologic: no seizure no involuntary movements         Lab Data:   Basic Metabolic Panel: Recent Labs  Lab 11/27/19 0519 11/30/19 1118 11/30/19 2351  NA 149* 139 138  K 4.4 4.9 4.9  CL 116* 111 112*  CO2 22 18* 18*  GLUCOSE 134* 208* 210*  BUN 24* 31* 32*  CREATININE 1.06 0.93 0.95  CALCIUM 8.9 9.0 9.0  MG  --  2.3 2.3    ABG: Recent Labs  Lab 11/27/19 0550 11/28/19 0537  PHART 7.372 7.431  PCO2ART 36.9 27.7*  PO2ART 107 90.7  HCO3 20.5 18.2*  O2SAT 96.7 96.9    Liver Function Tests: Recent Labs  Lab 11/27/19 0519  AST 44*  ALT 65*  ALKPHOS 62  BILITOT 0.8  PROT 6.5  ALBUMIN 2.5*   No results for input(s): LIPASE, AMYLASE in the last 168 hours. No results for input(s): AMMONIA in the last 168 hours.  CBC: Recent Labs  Lab 11/27/19 0519 12/02/19 0557  WBC 11.4* 14.0*  NEUTROABS 8.5*  --   HGB 12.8* 11.7*  HCT  40.5 36.0*  MCV 90.6 87.0  PLT 368 322    Cardiac Enzymes: No results for input(s): CKTOTAL, CKMB, CKMBINDEX, TROPONINI in the last 168 hours.  BNP (last 3 results) No results for input(s): BNP in the last 8760 hours.  ProBNP (last 3 results) No results for input(s): PROBNP in the last 8760 hours.  Radiological Exams: No results found.  Assessment/Plan Active Problems:   Acute on chronic respiratory failure with hypoxia (HCC)   COVID-19 virus infection   Acute respiratory distress syndrome (ARDS) due to 2019 novel coronavirus (HCC)   COPD, severe (HCC)   Chronic kidney disease, stage III (moderate)   1. Acute on chronic respiratory failure with hypoxia patient currently is on pressure support good tidal volumes are noted plan is to continue to advance the wean patient has not been able to go beyond 16 hours we will see how he does today 2. COVID-19 virus infection no change we will continue with present management 3. ARDS treated clinically is improved 4. Severe COPD at baseline 5. Chronic kidney disease stage III continue with supportive care monitoring labs closely   I have personally seen and evaluated the patient, evaluated laboratory and imaging results, formulated the assessment and plan and placed orders. The Patient requires high complexity decision making for assessment and support.  Case was discussed on  Rounds with the Respiratory Therapy Staff  Allyne Gee, MD [facility] Pulmonary Critical Care Medicine Sleep Medicine

## 2019-12-03 ENCOUNTER — Other Ambulatory Visit (HOSPITAL_COMMUNITY): Payer: Medicare Other

## 2019-12-03 DIAGNOSIS — U071 COVID-19: Secondary | ICD-10-CM | POA: Diagnosis not present

## 2019-12-03 DIAGNOSIS — N183 Chronic kidney disease, stage 3 unspecified: Secondary | ICD-10-CM | POA: Diagnosis not present

## 2019-12-03 DIAGNOSIS — J449 Chronic obstructive pulmonary disease, unspecified: Secondary | ICD-10-CM | POA: Diagnosis not present

## 2019-12-03 DIAGNOSIS — J9621 Acute and chronic respiratory failure with hypoxia: Secondary | ICD-10-CM | POA: Diagnosis not present

## 2019-12-03 LAB — BASIC METABOLIC PANEL
Anion gap: 11 (ref 5–15)
BUN: 30 mg/dL — ABNORMAL HIGH (ref 8–23)
CO2: 20 mmol/L — ABNORMAL LOW (ref 22–32)
Calcium: 8.4 mg/dL — ABNORMAL LOW (ref 8.9–10.3)
Chloride: 105 mmol/L (ref 98–111)
Creatinine, Ser: 1.09 mg/dL (ref 0.61–1.24)
GFR calc Af Amer: >60 mL/min (ref >60)
GFR calc non Af Amer: >60 mL/min (ref >60)
Glucose, Bld: 166 mg/dL — ABNORMAL HIGH (ref 70–99)
Potassium: 4.9 mmol/L (ref 3.5–5.1)
Sodium: 136 mmol/L (ref 135–145)

## 2019-12-03 LAB — BLOOD CULTURE ID PANEL (REFLEXED)

## 2019-12-03 LAB — MAGNESIUM: Magnesium: 2.4 mg/dL (ref 1.7–2.4)

## 2019-12-03 LAB — CBC
HCT: 33.4 % — ABNORMAL LOW (ref 39.0–52.0)
Hemoglobin: 11.3 g/dL — ABNORMAL LOW (ref 13.0–17.0)
MCH: 29.5 pg (ref 26.0–34.0)
MCHC: 33.8 g/dL (ref 30.0–36.0)
MCV: 87.2 fL (ref 80.0–100.0)
Platelets: 295 10*3/uL (ref 150–400)
RBC: 3.83 MIL/uL — ABNORMAL LOW (ref 4.22–5.81)
RDW: 17.2 % — ABNORMAL HIGH (ref 11.5–15.5)
WBC: 30.3 10*3/uL — ABNORMAL HIGH (ref 4.0–10.5)
nRBC: 0.2 % (ref 0.0–0.2)

## 2019-12-03 NOTE — Progress Notes (Addendum)
Pulmonary Critical Care Medicine [place]   PULMONARY CRITICAL CARE SERVICE  PROGRESS NOTE  Date of Service: 12/03/2019  Charles Peters.  [id number]:[id number]  DOB: Mar 24, 1956   DOA: 11/26/2019  Referring Physician: Merton Border, MD  HPI: Charles Peters. is a 63 y.o. male seen for follow up of Acute on Chronic Respiratory Failure.  Patient currently is on pressure support mode has been on 28% FiO2 no changes are noted from a respiratory perspective other than the new fever patient was up to temperature 104 Fahrenheit this will need to be worked up  Medications: Reviewed on Rounds  Physical Exam:  Vitals: Temperature 99.1 pulse 111 respiratory 18 blood pressure is 119/67 saturations 100%  Ventilator Settings mode ventilation pressure support FiO2 20% tidal volume 761 pressure poor 12 PEEP 5  . General: Comfortable at this time . Eyes: Grossly normal lids, irises & conjunctiva . ENT: grossly tongue is normal . Neck: no obvious mass . Cardiovascular: S1 S2 normal no gallop . Respiratory: No rhonchi no rales are noted at this time . Abdomen: soft . Skin: no rash seen on limited exam . Musculoskeletal: not rigid . Psychiatric:unable to assess . Neurologic: no seizure no involuntary movements         Lab Data:   Basic Metabolic Panel: Recent Labs  Lab 11/27/19 0519 11/30/19 1118 11/30/19 2351 12/03/19 0439  NA 149* 139 138 136  K 4.4 4.9 4.9 4.9  CL 116* 111 112* 105  CO2 22 18* 18* 20*  GLUCOSE 134* 208* 210* 166*  BUN 24* 31* 32* 30*  CREATININE 1.06 0.93 0.95 1.09  CALCIUM 8.9 9.0 9.0 8.4*  MG  --  2.3 2.3  --     ABG: Recent Labs  Lab 11/27/19 0550 11/28/19 0537  PHART 7.372 7.431  PCO2ART 36.9 27.7*  PO2ART 107 90.7  HCO3 20.5 18.2*  O2SAT 96.7 96.9    Liver Function Tests: Recent Labs  Lab 11/27/19 0519  AST 44*  ALT 65*  ALKPHOS 62  BILITOT 0.8  PROT 6.5  ALBUMIN 2.5*   No results for input(s): LIPASE, AMYLASE in  the last 168 hours. No results for input(s): AMMONIA in the last 168 hours.  CBC: Recent Labs  Lab 11/27/19 0519 12/02/19 0557 12/03/19 0439  WBC 11.4* 14.0* 30.3*  NEUTROABS 8.5*  --   --   HGB 12.8* 11.7* 11.3*  HCT 40.5 36.0* 33.4*  MCV 90.6 87.0 87.2  PLT 368 322 295    Cardiac Enzymes: No results for input(s): CKTOTAL, CKMB, CKMBINDEX, TROPONINI in the last 168 hours.  BNP (last 3 results) No results for input(s): BNP in the last 8760 hours.  ProBNP (last 3 results) No results for input(s): PROBNP in the last 8760 hours.  Radiological Exams: DG Chest Port 1 View  Result Date: 12/02/2019 CLINICAL DATA:  Pneumonia. EXAM: PORTABLE CHEST 1 VIEW COMPARISON:  November 28, 2019. FINDINGS: Stable cardiomediastinal silhouette. Tracheostomy tube is unchanged in position. Atherosclerosis of thoracic aorta is noted. No pneumothorax pleural effusion is noted. Right lung is clear. Minimal left basilar subsegmental atelectasis is noted. Bony thorax is unremarkable. IMPRESSION: Aortic atherosclerosis. Minimal left basilar subsegmental atelectasis. Electronically Signed   By: Marijo Conception M.D.   On: 12/02/2019 13:17    Assessment/Plan Active Problems:   Acute on chronic respiratory failure with hypoxia (HCC)   COVID-19 virus infection   Acute respiratory distress syndrome (ARDS) due to 2019 novel coronavirus (HCC)   COPD, severe (HCC)  Chronic kidney disease, stage III (moderate)   1. Acute on chronic respiratory failure hypoxia plan is to continue with pressure support mode for now fever will be worked up antibiotics adjusted continue with full support.  Chest x-ray showing some basilar subsegmental atelectasis no clear-cut pneumonia 2. COVID-19 virus infection treated should be improving 3. ARDS treated we will continue with supportive care 4. Severe COPD at baseline continue present management 5. Chronic kidney disease stage III following labs   I have personally seen and  evaluated the patient, evaluated laboratory and imaging results, formulated the assessment and plan and placed orders.  Time 35 minutes new fever The Patient requires high complexity decision making for assessment and support.  Case was discussed on Rounds with the Respiratory Therapy Staff  Yevonne Pax, MD [facility] Pulmonary Critical Care Medicine Sleep Medicine

## 2019-12-04 DIAGNOSIS — U071 COVID-19: Secondary | ICD-10-CM | POA: Diagnosis not present

## 2019-12-04 DIAGNOSIS — J80 Acute respiratory distress syndrome: Secondary | ICD-10-CM | POA: Diagnosis not present

## 2019-12-04 DIAGNOSIS — J9621 Acute and chronic respiratory failure with hypoxia: Secondary | ICD-10-CM | POA: Diagnosis not present

## 2019-12-04 DIAGNOSIS — N183 Chronic kidney disease, stage 3 unspecified: Secondary | ICD-10-CM | POA: Diagnosis not present

## 2019-12-04 LAB — BASIC METABOLIC PANEL
Anion gap: 11 (ref 5–15)
BUN: 35 mg/dL — ABNORMAL HIGH (ref 8–23)
CO2: 18 mmol/L — ABNORMAL LOW (ref 22–32)
Calcium: 8.5 mg/dL — ABNORMAL LOW (ref 8.9–10.3)
Chloride: 107 mmol/L (ref 98–111)
Creatinine, Ser: 1 mg/dL (ref 0.61–1.24)
GFR calc Af Amer: >60 mL/min (ref >60)
GFR calc non Af Amer: >60 mL/min (ref >60)
Glucose, Bld: 183 mg/dL — ABNORMAL HIGH (ref 70–99)
Potassium: 4.5 mmol/L (ref 3.5–5.1)
Sodium: 136 mmol/L (ref 135–145)

## 2019-12-04 LAB — URINE CULTURE: Culture: 80000 — AB

## 2019-12-04 LAB — CULTURE, RESPIRATORY W GRAM STAIN: Gram Stain: NONE SEEN

## 2019-12-04 LAB — CBC
HCT: 34.3 % — ABNORMAL LOW (ref 39.0–52.0)
Hemoglobin: 11.4 g/dL — ABNORMAL LOW (ref 13.0–17.0)
MCH: 28.8 pg (ref 26.0–34.0)
MCHC: 33.2 g/dL (ref 30.0–36.0)
MCV: 86.6 fL (ref 80.0–100.0)
Platelets: 270 10*3/uL (ref 150–400)
RBC: 3.96 MIL/uL — ABNORMAL LOW (ref 4.22–5.81)
RDW: 17.2 % — ABNORMAL HIGH (ref 11.5–15.5)
WBC: 30.3 10*3/uL — ABNORMAL HIGH (ref 4.0–10.5)
nRBC: 0.1 % (ref 0.0–0.2)

## 2019-12-04 LAB — MAGNESIUM: Magnesium: 2.4 mg/dL (ref 1.7–2.4)

## 2019-12-04 LAB — TROPONIN I (HIGH SENSITIVITY): Troponin I (High Sensitivity): 35 ng/L — ABNORMAL HIGH (ref <18)

## 2019-12-04 NOTE — Progress Notes (Signed)
Pulmonary Critical Care Medicine [place]   PULMONARY CRITICAL CARE SERVICE  PROGRESS NOTE  Date of Service: 12/04/2019  Charles Peters.  NAT:[id number]  DOB: 01/06/1956   DOA: 11/26/2019  Referring Physician: Merton Border, MD  HPI: Charles Pelc. is a 63 y.o. male seen for follow up of Acute on Chronic Respiratory Failure.  Patient currently is on the nag device is doing well with 2 L he continues to show improvement clinically  Medications: Reviewed on Rounds  Physical Exam:  Vitals: Temperature 98.4 pulse 98 respiratory 18 blood pressure is 147/82 saturations 98%  Ventilator Settings off the ventilator on the nag device requiring a 2 L oxygen bleeding  . General: Comfortable at this time . Eyes: Grossly normal lids, irises & conjunctiva . ENT: grossly tongue is normal . Neck: no obvious mass . Cardiovascular: S1 S2 normal no gallop . Respiratory: No rhonchi coarse breath sounds are noted at this time . Abdomen: soft . Skin: no rash seen on limited exam . Musculoskeletal: not rigid . Psychiatric:unable to assess . Neurologic: no seizure no involuntary movements         Lab Data:   Basic Metabolic Panel: Recent Labs  Lab 11/30/19 1118 11/30/19 2351 12/03/19 0439 12/03/19 1427 12/04/19 0516  NA 139 138 136  --  136  K 4.9 4.9 4.9  --  4.5  CL 111 112* 105  --  107  CO2 18* 18* 20*  --  18*  GLUCOSE 208* 210* 166*  --  183*  BUN 31* 32* 30*  --  35*  CREATININE 0.93 0.95 1.09  --  1.00  CALCIUM 9.0 9.0 8.4*  --  8.5*  MG 2.3 2.3  --  2.4 2.4    ABG: Recent Labs  Lab 11/28/19 0537  PHART 7.431  PCO2ART 27.7*  PO2ART 90.7  HCO3 18.2*  O2SAT 96.9    Liver Function Tests: No results for input(s): AST, ALT, ALKPHOS, BILITOT, PROT, ALBUMIN in the last 168 hours. No results for input(s): LIPASE, AMYLASE in the last 168 hours. No results for input(s): AMMONIA in the last 168 hours.  CBC: Recent Labs  Lab 12/02/19 0557  12/03/19 0439 12/04/19 0516  WBC 14.0* 30.3* 30.3*  HGB 11.7* 11.3* 11.4*  HCT 36.0* 33.4* 34.3*  MCV 87.0 87.2 86.6  PLT 322 295 270    Cardiac Enzymes: No results for input(s): CKTOTAL, CKMB, CKMBINDEX, TROPONINI in the last 168 hours.  BNP (last 3 results) No results for input(s): BNP in the last 8760 hours.  ProBNP (last 3 results) No results for input(s): PROBNP in the last 8760 hours.  Radiological Exams: No results found.  Assessment/Plan Active Problems:   Acute on chronic respiratory failure with hypoxia (HCC)   COVID-19 virus infection   Acute respiratory distress syndrome (ARDS) due to 2019 novel coronavirus (HCC)   COPD, severe (HCC)   Chronic kidney disease, stage III (moderate)   1. Acute on chronic respiratory failure with hypoxia the plan is to continue with oxygen bleed through the nag device 2. COVID-19 infection in resolution phase we'll continue to monitor patient has significant deficits post Covid 3. ARDS treated we'll continue with present management 4. Severe COPD right now is at baseline 5. Chronic kidney disease we'll continue to monitor.   I have personally seen and evaluated the patient, evaluated laboratory and imaging results, formulated the assessment and plan and placed orders. The Patient requires high complexity decision making for assessment and support.  Rounds were done with the Respiratory Therapy Director and Staff therapists and discussed with nursing staff also.  Allyne Gee, MD [facility] Pulmonary Critical Care Medicine Sleep Medicine

## 2019-12-05 DIAGNOSIS — J9621 Acute and chronic respiratory failure with hypoxia: Secondary | ICD-10-CM | POA: Diagnosis not present

## 2019-12-05 DIAGNOSIS — N183 Chronic kidney disease, stage 3 unspecified: Secondary | ICD-10-CM | POA: Diagnosis not present

## 2019-12-05 DIAGNOSIS — J80 Acute respiratory distress syndrome: Secondary | ICD-10-CM | POA: Diagnosis not present

## 2019-12-05 DIAGNOSIS — U071 COVID-19: Secondary | ICD-10-CM | POA: Diagnosis not present

## 2019-12-05 LAB — CULTURE, BLOOD (ROUTINE X 2)
Special Requests: ADEQUATE
Special Requests: ADEQUATE

## 2019-12-05 NOTE — Progress Notes (Signed)
Pulmonary Critical Care Medicine [place]   PULMONARY CRITICAL CARE SERVICE  PROGRESS NOTE  Date of Service: 12/05/2019  Charles Peters.  [id number]  DOB: 1956-01-26   DOA: 11/26/2019  Referring Physician: Merton Border, MD  HPI: Charles Peters. is a 63 y.o. male seen for follow up of Acute on Chronic Respiratory Failure.  Patient is on nag 2 L oxygen  Medications: Reviewed on Rounds  Physical Exam:  Vitals: Temperature 98.2 pulse 111 respiratory rate 23 blood pressure 156/84 saturations 98%  Ventilator Settings currently is off of the ventilator on 2 L oxygen  . General: Comfortable at this time . Eyes: Grossly normal lids, irises & conjunctiva . ENT: grossly tongue is normal . Neck: no obvious mass . Cardiovascular: S1 S2 normal no gallop . Respiratory: No rhonchi no rales are noted at this time . Abdomen: soft . Skin: no rash seen on limited exam . Musculoskeletal: not rigid . Psychiatric:unable to assess . Neurologic: no seizure no involuntary movements         Lab Data:   Basic Metabolic Panel: Recent Labs  Lab 11/30/19 1118 11/30/19 2351 12/03/19 0439 12/03/19 1427 12/04/19 0516  NA 139 138 136  --  136  K 4.9 4.9 4.9  --  4.5  CL 111 112* 105  --  107  CO2 18* 18* 20*  --  18*  GLUCOSE 208* 210* 166*  --  183*  BUN 31* 32* 30*  --  35*  CREATININE 0.93 0.95 1.09  --  1.00  CALCIUM 9.0 9.0 8.4*  --  8.5*  MG 2.3 2.3  --  2.4 2.4    ABG: No results for input(s): PHART, PCO2ART, PO2ART, HCO3, O2SAT in the last 168 hours.  Liver Function Tests: No results for input(s): AST, ALT, ALKPHOS, BILITOT, PROT, ALBUMIN in the last 168 hours. No results for input(s): LIPASE, AMYLASE in the last 168 hours. No results for input(s): AMMONIA in the last 168 hours.  CBC: Recent Labs  Lab 12/02/19 0557 12/03/19 0439 12/04/19 0516  WBC 14.0* 30.3* 30.3*  HGB 11.7* 11.3* 11.4*  HCT 36.0* 33.4* 34.3*  MCV 87.0 87.2 86.6  PLT  322 295 270    Cardiac Enzymes: No results for input(s): CKTOTAL, CKMB, CKMBINDEX, TROPONINI in the last 168 hours.  BNP (last 3 results) No results for input(s): BNP in the last 8760 hours.  ProBNP (last 3 results) No results for input(s): PROBNP in the last 8760 hours.  Radiological Exams: No results found.  Assessment/Plan Active Problems:   Acute on chronic respiratory failure with hypoxia (HCC)   COVID-19 virus infection   Acute respiratory distress syndrome (ARDS) due to 2019 novel coronavirus (HCC)   COPD, severe (HCC)   Chronic kidney disease, stage III (moderate)   1. Acute on chronic respiratory failure with hypoxia the plan is to continue with NAG device the patient has been tolerating the 2 L of oxygen fairly well 2. COVID-19 virus infection at baseline we will continue with supportive care 3. ARDS treated we will continue to monitor 4. Severe COPD at baseline 5. Chronic kidney disease stage III continue with supportive care   I have personally seen and evaluated the patient, evaluated laboratory and imaging results, formulated the assessment and plan and placed orders. The Patient requires high complexity decision making for assessment and support.  Rounds were done with the Respiratory Therapy Director and Staff therapists and discussed with nursing staff also.  Allyne Gee,  MD [facility] Pulmonary Critical Care Medicine Sleep Medicine

## 2019-12-06 DIAGNOSIS — U071 COVID-19: Secondary | ICD-10-CM | POA: Diagnosis not present

## 2019-12-06 DIAGNOSIS — J80 Acute respiratory distress syndrome: Secondary | ICD-10-CM | POA: Diagnosis not present

## 2019-12-06 DIAGNOSIS — N183 Chronic kidney disease, stage 3 unspecified: Secondary | ICD-10-CM | POA: Diagnosis not present

## 2019-12-06 DIAGNOSIS — J9621 Acute and chronic respiratory failure with hypoxia: Secondary | ICD-10-CM | POA: Diagnosis not present

## 2019-12-06 LAB — CULTURE, RESPIRATORY W GRAM STAIN

## 2019-12-06 NOTE — Progress Notes (Signed)
Pulmonary Critical Care Medicine [facility] [place]   PULMONARY CRITICAL CARE SERVICE  PROGRESS NOTE  Date of Service: 12/06/2019  Charles Peters.  [id number]:[id number]  DOB: Nov 19, 1956   DOA: 11/26/2019  Referring Physician: Carron Curie, MD  HPI: Charles Peters. is a 64 y.o. male seen for follow up of Acute on Chronic Respiratory Failure.  Patient is off the ventilator currently on nag device requiring about 2 L oxygen  Medications: Reviewed on Rounds  Physical Exam:  Vitals: Temperature 98.6 pulse 118 respiratory rate 20 blood pressure is 171/93 saturations 98%  Ventilator Settings off the ventilator on 3 L  . General: Comfortable at this time . Eyes: Grossly normal lids, irises & conjunctiva . ENT: grossly tongue is normal . Neck: no obvious mass . Cardiovascular: S1 S2 normal no gallop . Respiratory: No rhonchi coarse breath sounds are noted . Abdomen: soft . Skin: no rash seen on limited exam . Musculoskeletal: not rigid . Psychiatric:unable to assess . Neurologic: no seizure no involuntary movements         Lab Data:   Basic Metabolic Panel: Recent Labs  Lab 11/30/19 1118 11/30/19 2351 12/03/19 0439 12/03/19 1427 12/04/19 0516  NA 139 138 136  --  136  K 4.9 4.9 4.9  --  4.5  CL 111 112* 105  --  107  CO2 18* 18* 20*  --  18*  GLUCOSE 208* 210* 166*  --  183*  BUN 31* 32* 30*  --  35*  CREATININE 0.93 0.95 1.09  --  1.00  CALCIUM 9.0 9.0 8.4*  --  8.5*  MG 2.3 2.3  --  2.4 2.4    ABG: No results for input(s): PHART, PCO2ART, PO2ART, HCO3, O2SAT in the last 168 hours.  Liver Function Tests: No results for input(s): AST, ALT, ALKPHOS, BILITOT, PROT, ALBUMIN in the last 168 hours. No results for input(s): LIPASE, AMYLASE in the last 168 hours. No results for input(s): AMMONIA in the last 168 hours.  CBC: Recent Labs  Lab 12/02/19 0557 12/03/19 0439 12/04/19 0516  WBC 14.0* 30.3* 30.3*  HGB 11.7* 11.3* 11.4*  HCT 36.0* 33.4* 34.3*   MCV 87.0 87.2 86.6  PLT 322 295 270    Cardiac Enzymes: No results for input(s): CKTOTAL, CKMB, CKMBINDEX, TROPONINI in the last 168 hours.  BNP (last 3 results) No results for input(s): BNP in the last 8760 hours.  ProBNP (last 3 results) No results for input(s): PROBNP in the last 8760 hours.  Radiological Exams: No results found.  Assessment/Plan Active Problems:   Acute on chronic respiratory failure with hypoxia (HCC)   COVID-19 virus infection   Acute respiratory distress syndrome (ARDS) due to 2019 novel coronavirus (HCC)   COPD, severe (HCC)   Chronic kidney disease, stage III (moderate)   1. Acute on chronic respiratory failure hypoxia plan is to continue with oxygen therapy with the nag device 2. COVID-19 virus infection clinically improving 3. ARDS slowly improving we will continue with supportive care follow radiologically 4. Severe COPD nebulizers as necessary 5. Chronic kidney disease labs are stable we will continue to monitor   I have personally seen and evaluated the patient, evaluated laboratory and imaging results, formulated the assessment and plan and placed orders. The Patient requires high complexity decision making for assessment and support.  Rounds were done with the Respiratory Therapy Director and Staff therapists and discussed with nursing staff also.  Yevonne Pax, MD [facility] Pulmonary Critical Care Medicine Sleep  Medicine

## 2019-12-07 DIAGNOSIS — N183 Chronic kidney disease, stage 3 unspecified: Secondary | ICD-10-CM | POA: Diagnosis not present

## 2019-12-07 DIAGNOSIS — J9621 Acute and chronic respiratory failure with hypoxia: Secondary | ICD-10-CM | POA: Diagnosis not present

## 2019-12-07 DIAGNOSIS — U071 COVID-19: Secondary | ICD-10-CM | POA: Diagnosis not present

## 2019-12-07 DIAGNOSIS — J80 Acute respiratory distress syndrome: Secondary | ICD-10-CM | POA: Diagnosis not present

## 2019-12-07 LAB — BASIC METABOLIC PANEL
Anion gap: 10 (ref 5–15)
BUN: 36 mg/dL — ABNORMAL HIGH (ref 8–23)
CO2: 18 mmol/L — ABNORMAL LOW (ref 22–32)
Calcium: 8.5 mg/dL — ABNORMAL LOW (ref 8.9–10.3)
Chloride: 110 mmol/L (ref 98–111)
Creatinine, Ser: 0.8 mg/dL (ref 0.61–1.24)
GFR calc Af Amer: >60 mL/min (ref >60)
GFR calc non Af Amer: >60 mL/min (ref >60)
Glucose, Bld: 192 mg/dL — ABNORMAL HIGH (ref 70–99)
Potassium: 4.9 mmol/L (ref 3.5–5.1)
Sodium: 138 mmol/L (ref 135–145)

## 2019-12-07 LAB — CBC WITH DIFFERENTIAL/PLATELET
Abs Immature Granulocytes: 0.48 10*3/uL — ABNORMAL HIGH (ref 0.00–0.07)
Basophils Absolute: 0.1 10*3/uL (ref 0.0–0.1)
Basophils Relative: 0 %
Eosinophils Absolute: 0 10*3/uL (ref 0.0–0.5)
Eosinophils Relative: 0 %
HCT: 35 % — ABNORMAL LOW (ref 39.0–52.0)
Hemoglobin: 11.7 g/dL — ABNORMAL LOW (ref 13.0–17.0)
Immature Granulocytes: 4 %
Lymphocytes Relative: 13 %
Lymphs Abs: 1.8 10*3/uL (ref 0.7–4.0)
MCH: 29.1 pg (ref 26.0–34.0)
MCHC: 33.4 g/dL (ref 30.0–36.0)
MCV: 87.1 fL (ref 80.0–100.0)
Monocytes Absolute: 0.5 10*3/uL (ref 0.1–1.0)
Monocytes Relative: 4 %
Neutro Abs: 10.5 10*3/uL — ABNORMAL HIGH (ref 1.7–7.7)
Neutrophils Relative %: 79 %
Platelets: 227 10*3/uL (ref 150–400)
RBC: 4.02 MIL/uL — ABNORMAL LOW (ref 4.22–5.81)
RDW: 17.6 % — ABNORMAL HIGH (ref 11.5–15.5)
WBC: 13.3 10*3/uL — ABNORMAL HIGH (ref 4.0–10.5)
nRBC: 0 % (ref 0.0–0.2)

## 2019-12-07 NOTE — Progress Notes (Addendum)
Pulmonary Critical Care Medicine [facility] [place]   PULMONARY CRITICAL CARE SERVICE  PROGRESS NOTE  Date of Service: 12/07/2019  [name]  ELF:[id number]  DOB: 28-Aug-1956   DOA: 11/26/2019  Referring Physician: Carron Curie, MD  HPI: Charles Peters. is a 64 y.o. male seen for follow up of Acute on Chronic Respiratory Failure.  Patient currently is on the NAG device on 2 L oxygen  Medications: Reviewed on Rounds  Physical Exam:  Vitals: Temperature 96.8 pulse 79 respiratory rate 21 blood pressure is 149/81 saturations 96%  Ventilator Settings off the ventilator on the NAG with 2 L oxygen  . General: Comfortable at this time . Eyes: Grossly normal lids, irises & conjunctiva . ENT: grossly tongue is normal . Neck: no obvious mass . Cardiovascular: S1 S2 normal no gallop . Respiratory: No rhonchi no rales are noted at this time . Abdomen: soft . Skin: no rash seen on limited exam . Musculoskeletal: not rigid . Psychiatric:unable to assess . Neurologic: no seizure no involuntary movements         Lab Data:   Basic Metabolic Panel: Recent Labs  Lab 11/30/19 2351 12/03/19 0439 12/03/19 1427 12/04/19 0516 12/07/19 0423  NA 138 136  --  136 138  K 4.9 4.9  --  4.5 4.9  CL 112* 105  --  107 110  CO2 18* 20*  --  18* 18*  GLUCOSE 210* 166*  --  183* 192*  BUN 32* 30*  --  35* 36*  CREATININE 0.95 1.09  --  1.00 0.80  CALCIUM 9.0 8.4*  --  8.5* 8.5*  MG 2.3  --  2.4 2.4  --     ABG: No results for input(s): PHART, PCO2ART, PO2ART, HCO3, O2SAT in the last 168 hours.  Liver Function Tests: No results for input(s): AST, ALT, ALKPHOS, BILITOT, PROT, ALBUMIN in the last 168 hours. No results for input(s): LIPASE, AMYLASE in the last 168 hours. No results for input(s): AMMONIA in the last 168 hours.  CBC: Recent Labs  Lab 12/02/19 0557 12/03/19 0439 12/04/19 0516 12/07/19 0423  WBC 14.0* 30.3* 30.3* 13.3*  NEUTROABS  --   --   --  10.5*   HGB 11.7* 11.3* 11.4* 11.7*  HCT 36.0* 33.4* 34.3* 35.0*  MCV 87.0 87.2 86.6 87.1  PLT 322 295 270 227    Cardiac Enzymes: No results for input(s): CKTOTAL, CKMB, CKMBINDEX, TROPONINI in the last 168 hours.  BNP (last 3 results) No results for input(s): BNP in the last 8760 hours.  ProBNP (last 3 results) No results for input(s): PROBNP in the last 8760 hours.  Radiological Exams: No results found.  Assessment/Plan Active Problems:   Acute on chronic respiratory failure with hypoxia (HCC)   COVID-19 virus infection   Acute respiratory distress syndrome (ARDS) due to 2019 novel coronavirus (HCC)   COPD, severe (HCC)   Chronic kidney disease, stage III (moderate)   1. Acute on chronic respiratory failure with hypoxia patient is off the ventilator right now on 2 L oxygen the goal is for 20 hours 2. COVID-19 virus infection treated we will continue with supportive care 3. ARDS at baseline continue present management 4. Severe COPD at baseline continue with supportive care 5. Chronic kidney disease stage III we will continue present management   I have personally seen and evaluated the patient, evaluated laboratory and imaging results, formulated the assessment and plan and placed orders. The Patient requires high complexity decision making  with multiple systems involvement.  Rounds were done with the Respiratory Therapy Director and Staff therapists and discussed with nursing staff also.  Allyne Gee, MD [facility] Pulmonary Critical Care Medicine Sleep Medicine

## 2019-12-08 DIAGNOSIS — N183 Chronic kidney disease, stage 3 unspecified: Secondary | ICD-10-CM | POA: Diagnosis not present

## 2019-12-08 DIAGNOSIS — J9621 Acute and chronic respiratory failure with hypoxia: Secondary | ICD-10-CM | POA: Diagnosis not present

## 2019-12-08 DIAGNOSIS — U071 COVID-19: Secondary | ICD-10-CM | POA: Diagnosis not present

## 2019-12-08 DIAGNOSIS — J80 Acute respiratory distress syndrome: Secondary | ICD-10-CM | POA: Diagnosis not present

## 2019-12-08 NOTE — Progress Notes (Signed)
Pulmonary Critical Care Medicine [facility] [place]   PULMONARY CRITICAL CARE SERVICE  PROGRESS NOTE  Date of Service: 12/08/2019  Charles Peters.  QIW:[id number]  DOB: 04-16-1956   DOA: 11/26/2019  Referring Physician: Carron Curie, MD  HPI: Charles Peters. is a 64 y.o. male seen for follow up of Acute on Chronic Respiratory Failure.  Patient is on NAG device currently has completed 24 hours right now is requiring 2 L oxygen  Medications: Reviewed on Rounds  Physical Exam:  Vitals: Temperature 96.8 pulse 103 respiratory rate 22 blood pressure 134/67 saturations 97%  Ventilator Settings off the ventilator on NAG  . General: Comfortable at this time . Eyes: Grossly normal lids, irises & conjunctiva . ENT: grossly tongue is normal . Neck: no obvious mass . Cardiovascular: S1 S2 normal no gallop . Respiratory: Scattered rhonchi expansion is equal . Abdomen: soft . Skin: no rash seen on limited exam . Musculoskeletal: not rigid . Psychiatric:unable to assess . Neurologic: no seizure no involuntary movements         Lab Data:   Basic Metabolic Panel: Recent Labs  Lab 12/03/19 0439 12/03/19 1427 12/04/19 0516 12/07/19 0423  NA 136  --  136 138  K 4.9  --  4.5 4.9  CL 105  --  107 110  CO2 20*  --  18* 18*  GLUCOSE 166*  --  183* 192*  BUN 30*  --  35* 36*  CREATININE 1.09  --  1.00 0.80  CALCIUM 8.4*  --  8.5* 8.5*  MG  --  2.4 2.4  --     ABG: No results for input(s): PHART, PCO2ART, PO2ART, HCO3, O2SAT in the last 168 hours.  Liver Function Tests: No results for input(s): AST, ALT, ALKPHOS, BILITOT, PROT, ALBUMIN in the last 168 hours. No results for input(s): LIPASE, AMYLASE in the last 168 hours. No results for input(s): AMMONIA in the last 168 hours.  CBC: Recent Labs  Lab 12/02/19 0557 12/03/19 0439 12/04/19 0516 12/07/19 0423  WBC 14.0* 30.3* 30.3* 13.3*  NEUTROABS  --   --   --  10.5*  HGB 11.7* 11.3* 11.4* 11.7*  HCT 36.0*  33.4* 34.3* 35.0*  MCV 87.0 87.2 86.6 87.1  PLT 322 295 270 227    Cardiac Enzymes: No results for input(s): CKTOTAL, CKMB, CKMBINDEX, TROPONINI in the last 168 hours.  BNP (last 3 results) No results for input(s): BNP in the last 8760 hours.  ProBNP (last 3 results) No results for input(s): PROBNP in the last 8760 hours.  Radiological Exams: No results found.  Assessment/Plan Active Problems:   Acute on chronic respiratory failure with hypoxia (HCC)   COVID-19 virus infection   Acute respiratory distress syndrome (ARDS) due to 2019 novel coronavirus (HCC)   COPD, severe (HCC)   Chronic kidney disease, stage III (moderate)   1. Acute on chronic respiratory failure with hypoxia plan is to continue with weaning completed 24 hours hopefully we should be able to begin capping trials soon 2. COVID-19 virus infection resolved 3. ARDS slowly resolving we will continue with supportive care 4. Severe COPD nebulizers as necessary 5. Chronic kidney disease continue with supportive care   I have personally seen and evaluated the patient, evaluated laboratory and imaging results, formulated the assessment and plan and placed orders. The Patient requires high complexity decision making with multiple systems involvement.  Rounds were done with the Respiratory Therapy Director and Staff therapists and discussed with nursing staff also.  Allyne Gee, MD [facility] Pulmonary Critical Care Medicine Sleep Medicine

## 2019-12-09 ENCOUNTER — Other Ambulatory Visit (HOSPITAL_COMMUNITY): Payer: Medicare Other

## 2019-12-09 DIAGNOSIS — J449 Chronic obstructive pulmonary disease, unspecified: Secondary | ICD-10-CM | POA: Diagnosis not present

## 2019-12-09 DIAGNOSIS — U071 COVID-19: Secondary | ICD-10-CM | POA: Diagnosis not present

## 2019-12-09 DIAGNOSIS — N183 Chronic kidney disease, stage 3 unspecified: Secondary | ICD-10-CM | POA: Diagnosis not present

## 2019-12-09 DIAGNOSIS — J9621 Acute and chronic respiratory failure with hypoxia: Secondary | ICD-10-CM | POA: Diagnosis not present

## 2019-12-09 MED ORDER — GENERIC EXTERNAL MEDICATION
Status: DC
Start: ? — End: 2019-12-09

## 2019-12-09 NOTE — Progress Notes (Signed)
Pulmonary Critical Care Medicine [facility] [place]   PULMONARY CRITICAL CARE SERVICE  PROGRESS NOTE  Date of Service: 12/09/2019  Charles Peters.  [id number]:[id number]  DOB: January 12, 1956   DOA: 11/26/2019  Referring Physician: Carron Curie, MD  HPI: Charles Rawl. is a 64 y.o. male seen for follow up of Acute on Chronic Respiratory Failure.  Patient currently is on the NAG device has been actually doing fairly well needs to have the tracheostomy changed which we went ahead and did today.  Hopefully we can start capping trials once again.  Secretions are minimal.  Patient has had no significant desaturations noted  Medications: Reviewed on Rounds  Physical Exam:  Vitals: Temperature 97.9 pulse 112 respiratory 25 blood pressure is 136/61 saturations 97%  Ventilator Settings on the NAG device patient will have the tracheostomy changed out  . General: Comfortable at this time . Eyes: Grossly normal lids, irises & conjunctiva . ENT: grossly tongue is normal . Neck: no obvious mass . Cardiovascular: S1 S2 normal no gallop . Respiratory: Scattered coarse rhonchi expansion is equal . Abdomen: soft . Skin: no rash seen on limited exam . Musculoskeletal: not rigid . Psychiatric:unable to assess . Neurologic: no seizure no involuntary movements         Lab Data:   Basic Metabolic Panel: Recent Labs  Lab 12/03/19 0439 12/03/19 1427 12/04/19 0516 12/07/19 0423  NA 136  --  136 138  K 4.9  --  4.5 4.9  CL 105  --  107 110  CO2 20*  --  18* 18*  GLUCOSE 166*  --  183* 192*  BUN 30*  --  35* 36*  CREATININE 1.09  --  1.00 0.80  CALCIUM 8.4*  --  8.5* 8.5*  MG  --  2.4 2.4  --     ABG: No results for input(s): PHART, PCO2ART, PO2ART, HCO3, O2SAT in the last 168 hours.  Liver Function Tests: No results for input(s): AST, ALT, ALKPHOS, BILITOT, PROT, ALBUMIN in the last 168 hours. No results for input(s): LIPASE, AMYLASE in the last 168 hours. No results for  input(s): AMMONIA in the last 168 hours.  CBC: Recent Labs  Lab 12/03/19 0439 12/04/19 0516 12/07/19 0423  WBC 30.3* 30.3* 13.3*  NEUTROABS  --   --  10.5*  HGB 11.3* 11.4* 11.7*  HCT 33.4* 34.3* 35.0*  MCV 87.2 86.6 87.1  PLT 295 270 227    Cardiac Enzymes: No results for input(s): CKTOTAL, CKMB, CKMBINDEX, TROPONINI in the last 168 hours.  BNP (last 3 results) No results for input(s): BNP in the last 8760 hours.  ProBNP (last 3 results) No results for input(s): PROBNP in the last 8760 hours.  Radiological Exams: No results found.  Assessment/Plan Active Problems:   Acute on chronic respiratory failure with hypoxia (HCC)   COVID-19 virus infection   Acute respiratory distress syndrome (ARDS) due to 2019 novel coronavirus (HCC)   COPD, severe (HCC)   Chronic kidney disease, stage III (moderate)   1. Acute on chronic respiratory failure hypoxia plan is to continue to advance the weaning we will go ahead and change the device to capping today after the tracheostomy was changed without any difficulty.  Patient will now have a cuffless trach in place so that capping can begin. 2. COVID-19 virus infection resolved 3. ARDS slow to improve with residual deficits noted post COVID-19 4. Severe COPD nebulizers as necessary 5. Chronic kidney disease moderate disease the last set of  labs have shown stability of the renal function at this point   I have personally seen and evaluated the patient, evaluated laboratory and imaging results, formulated the assessment and plan and placed orders. The Patient requires high complexity decision making with multiple systems involvement.  Rounds were done with the Respiratory Therapy Director and Staff therapists and discussed with nursing staff also.  Time 35 minutes tracheostomy change  Allyne Gee, MD [facility] Pulmonary Critical Care Medicine Sleep Medicine

## 2019-12-10 DIAGNOSIS — J80 Acute respiratory distress syndrome: Secondary | ICD-10-CM | POA: Diagnosis not present

## 2019-12-10 DIAGNOSIS — J9621 Acute and chronic respiratory failure with hypoxia: Secondary | ICD-10-CM | POA: Diagnosis not present

## 2019-12-10 DIAGNOSIS — N183 Chronic kidney disease, stage 3 unspecified: Secondary | ICD-10-CM | POA: Diagnosis not present

## 2019-12-10 DIAGNOSIS — U071 COVID-19: Secondary | ICD-10-CM | POA: Diagnosis not present

## 2019-12-10 NOTE — Progress Notes (Signed)
Pulmonary Critical Care Medicine [facility] [place]   PULMONARY CRITICAL CARE SERVICE  PROGRESS NOTE  Date of Service: 12/10/2019  Charles Peters.  [id number]  DOB: [birth year]   DOA: 11/26/2019  Referring Physician: Carron Curie, MD  HPI: Charles Peters. is a 64 y.o. male seen for follow up of Acute on Chronic Respiratory Failure.  Patient is capping will be completing 24 hours today.  Is on 2 L oxygen doing very well  Medications: Reviewed on Rounds  Physical Exam:  Vitals: Temperature 98.8 pulse 96 respiratory rate 24 blood pressure is 145/87 saturations 98%  Ventilator Settings capping off the ventilator on 2 L oxygen  . General: Comfortable at this time . Eyes: Grossly normal lids, irises & conjunctiva . ENT: grossly tongue is normal . Neck: no obvious mass . Cardiovascular: S1 S2 normal no gallop . Respiratory: No rhonchi no rales are noted at this time . Abdomen: soft . Skin: no rash seen on limited exam . Musculoskeletal: not rigid . Psychiatric:unable to assess . Neurologic: no seizure no involuntary movements         Lab Data:   Basic Metabolic Panel: Recent Labs  Lab 12/03/19 1427 12/04/19 0516 12/07/19 0423  NA  --  136 138  K  --  4.5 4.9  CL  --  107 110  CO2  --  18* 18*  GLUCOSE  --  183* 192*  BUN  --  35* 36*  CREATININE  --  1.00 0.80  CALCIUM  --  8.5* 8.5*  MG 2.4 2.4  --     ABG: No results for input(s): PHART, PCO2ART, PO2ART, HCO3, O2SAT in the last 168 hours.  Liver Function Tests: No results for input(s): AST, ALT, ALKPHOS, BILITOT, PROT, ALBUMIN in the last 168 hours. No results for input(s): LIPASE, AMYLASE in the last 168 hours. No results for input(s): AMMONIA in the last 168 hours.  CBC: Recent Labs  Lab 12/04/19 0516 12/07/19 0423  WBC 30.3* 13.3*  NEUTROABS  --  10.5*  HGB 11.4* 11.7*  HCT 34.3* 35.0*  MCV 86.6 87.1  PLT 270 227    Cardiac Enzymes: No results for input(s): CKTOTAL, CKMB,  CKMBINDEX, TROPONINI in the last 168 hours.  BNP (last 3 results) No results for input(s): BNP in the last 8760 hours.  ProBNP (last 3 results) No results for input(s): PROBNP in the last 8760 hours.  Radiological Exams: No results found.  Assessment/Plan Active Problems:   Acute on chronic respiratory failure with hypoxia (HCC)   COVID-19 virus infection   Acute respiratory distress syndrome (ARDS) due to 2019 novel coronavirus (HCC)   COPD, severe (HCC)   Chronic kidney disease, stage III (moderate)   1. Acute on chronic respiratory failure with hypoxia plan is to continue with capping trials and hopefully after 72 hours will be a candidate for decannulation 2. COVID-19 virus infection treated now in resolution phase 3. ARDS due to COVID-19 with some residual deficits need to monitor oxygenation closely 4. Severe COPD at baseline 5. Chronic kidney disease stage III stable labs we will continue to monitor closely.   I have personally seen and evaluated the patient, evaluated laboratory and imaging results, formulated the assessment and plan and placed orders. The Patient requires high complexity decision making with multiple systems involvement.  Rounds were done with the Respiratory Therapy Director and Staff therapists and discussed with nursing staff also.  Yevonne Pax, MD [place] Pulmonary Critical Care Medicine Sleep Medicine

## 2019-12-11 DIAGNOSIS — J80 Acute respiratory distress syndrome: Secondary | ICD-10-CM | POA: Diagnosis not present

## 2019-12-11 DIAGNOSIS — J9621 Acute and chronic respiratory failure with hypoxia: Secondary | ICD-10-CM | POA: Diagnosis not present

## 2019-12-11 DIAGNOSIS — N183 Chronic kidney disease, stage 3 unspecified: Secondary | ICD-10-CM | POA: Diagnosis not present

## 2019-12-11 DIAGNOSIS — U071 COVID-19: Secondary | ICD-10-CM | POA: Diagnosis not present

## 2019-12-11 NOTE — Progress Notes (Signed)
Pulmonary Critical Care Medicine [facility] [place]   PULMONARY CRITICAL CARE SERVICE  PROGRESS NOTE  Date of Service: 12/11/2019  Charles Peters.  [id number]  DOB: 03-02-1956   DOA: 11/26/2019  Referring Physician: Carron Curie, MD  HPI: Charles Peters. is a 64 y.o. male seen for follow up of Acute on Chronic Respiratory Failure.  Patient is capping right now today will be 48 hours is requiring 1 L oxygen  Medications: Reviewed on Rounds  Physical Exam:  Vitals: Temperature 96.9 pulse 111 respiratory rate 20 blood pressure is 128/64 saturations 97%  Ventilator Settings capping off the ventilator 1 L oxygen  . General: Comfortable at this time . Eyes: Grossly normal lids, irises & conjunctiva . ENT: grossly tongue is normal . Neck: no obvious mass . Cardiovascular: S1 S2 normal no gallop . Respiratory: No rhonchi no rales are noted at this time . Abdomen: soft . Skin: no rash seen on limited exam . Musculoskeletal: not rigid . Psychiatric:unable to assess . Neurologic: no seizure no involuntary movements         Lab Data:   Basic Metabolic Panel: Recent Labs  Lab 12/07/19 0423  NA 138  K 4.9  CL 110  CO2 18*  GLUCOSE 192*  BUN 36*  CREATININE 0.80  CALCIUM 8.5*    ABG: No results for input(s): PHART, PCO2ART, PO2ART, HCO3, O2SAT in the last 168 hours.  Liver Function Tests: No results for input(s): AST, ALT, ALKPHOS, BILITOT, PROT, ALBUMIN in the last 168 hours. No results for input(s): LIPASE, AMYLASE in the last 168 hours. No results for input(s): AMMONIA in the last 168 hours.  CBC: Recent Labs  Lab 12/07/19 0423  WBC 13.3*  NEUTROABS 10.5*  HGB 11.7*  HCT 35.0*  MCV 87.1  PLT 227    Cardiac Enzymes: No results for input(s): CKTOTAL, CKMB, CKMBINDEX, TROPONINI in the last 168 hours.  BNP (last 3 results) No results for input(s): BNP in the last 8760 hours.  ProBNP (last 3 results) No results for input(s): PROBNP in  the last 8760 hours.  Radiological Exams: No results found.  Assessment/Plan Active Problems:   Acute on chronic respiratory failure with hypoxia (HCC)   COVID-19 virus infection   Acute respiratory distress syndrome (ARDS) due to 2019 novel coronavirus (HCC)   COPD, severe (HCC)   Chronic kidney disease, stage III (moderate)   1. Acute on chronic respiratory failure with hypoxia continue with capping trials patient right now is on 48-hour goal 2. COVID-19 virus infection resolved 3. Acute respiratory distress slowly improving we will continue to monitor 4. Severe COPD at baseline 5. Chronic kidney disease stage III supportive care   I have personally seen and evaluated the patient, evaluated laboratory and imaging results, formulated the assessment and plan and placed orders. The Patient requires high complexity decision making with multiple systems involvement.  Rounds were done with the Respiratory Therapy Director and Staff therapists and discussed with nursing staff also.  Yevonne Pax, MD [facility] Pulmonary Critical Care Medicine Sleep Medicine

## 2019-12-12 DIAGNOSIS — N183 Chronic kidney disease, stage 3 unspecified: Secondary | ICD-10-CM | POA: Diagnosis not present

## 2019-12-12 DIAGNOSIS — J9621 Acute and chronic respiratory failure with hypoxia: Secondary | ICD-10-CM | POA: Diagnosis not present

## 2019-12-12 DIAGNOSIS — U071 COVID-19: Secondary | ICD-10-CM | POA: Diagnosis not present

## 2019-12-12 DIAGNOSIS — J80 Acute respiratory distress syndrome: Secondary | ICD-10-CM | POA: Diagnosis not present

## 2019-12-12 NOTE — Progress Notes (Signed)
Pulmonary Critical Care Medicine [facility] [place]   PULMONARY CRITICAL CARE SERVICE  PROGRESS NOTE  Date of Service: 12/12/2019  Charles Peters.  [id number]  DOB: 29-Sep-1956   DOA: 11/26/2019  Referring Physician: Carron Curie, MD  HPI: Charles Peters. is a 64 y.o. male seen for follow up of Acute on Chronic Respiratory Failure.  Patient is capping today will be more than 72 hours on capping trials  Medications: Reviewed on Rounds  Physical Exam:  Vitals: Temperature 96.8 pulse 104 respiratory rate 22 blood pressure is 152/67 saturations 98%  Ventilator Settings capping right now  . General: Comfortable at this time . Eyes: Grossly normal lids, irises & conjunctiva . ENT: grossly tongue is normal . Neck: no obvious mass . Cardiovascular: S1 S2 normal no gallop . Respiratory: No rhonchi no rales are noted . Abdomen: soft . Skin: no rash seen on limited exam . Musculoskeletal: not rigid . Psychiatric:unable to assess . Neurologic: no seizure no involuntary movements         Lab Data:   Basic Metabolic Panel: Recent Labs  Lab 12/07/19 0423  NA 138  K 4.9  CL 110  CO2 18*  GLUCOSE 192*  BUN 36*  CREATININE 0.80  CALCIUM 8.5*    ABG: No results for input(s): PHART, PCO2ART, PO2ART, HCO3, O2SAT in the last 168 hours.  Liver Function Tests: No results for input(s): AST, ALT, ALKPHOS, BILITOT, PROT, ALBUMIN in the last 168 hours. No results for input(s): LIPASE, AMYLASE in the last 168 hours. No results for input(s): AMMONIA in the last 168 hours.  CBC: Recent Labs  Lab 12/07/19 0423  WBC 13.3*  NEUTROABS 10.5*  HGB 11.7*  HCT 35.0*  MCV 87.1  PLT 227    Cardiac Enzymes: No results for input(s): CKTOTAL, CKMB, CKMBINDEX, TROPONINI in the last 168 hours.  BNP (last 3 results) No results for input(s): BNP in the last 8760 hours.  ProBNP (last 3 results) No results for input(s): PROBNP in the last 8760 hours.  Radiological  Exams: No results found.  Assessment/Plan Active Problems:   Acute on chronic respiratory failure with hypoxia (HCC)   COVID-19 virus infection   Acute respiratory distress syndrome (ARDS) due to 2019 novel coronavirus (HCC)   COPD, severe (HCC)   Chronic kidney disease, stage III (moderate)   1. Acute on chronic respiratory failure hypoxia plan is to proceed to decannulation we'll continue with supportive care 2. COVID-19 virus infection resolved 3. ARDS clinically resolved 4. Severe COPD right now at baseline 5. Chronic kidney disease continue to monitor labs   I have personally seen and evaluated the patient, evaluated laboratory and imaging results, formulated the assessment and plan and placed orders. The Patient requires high complexity decision making with multiple systems involvement.  Rounds were done with the Respiratory Therapy Director and Staff therapists and discussed with nursing staff also.  Yevonne Pax, MD [facility] Pulmonary Critical Care Medicine Sleep Medicine

## 2019-12-13 DIAGNOSIS — J80 Acute respiratory distress syndrome: Secondary | ICD-10-CM | POA: Diagnosis not present

## 2019-12-13 DIAGNOSIS — U071 COVID-19: Secondary | ICD-10-CM | POA: Diagnosis not present

## 2019-12-13 DIAGNOSIS — J9621 Acute and chronic respiratory failure with hypoxia: Secondary | ICD-10-CM | POA: Diagnosis not present

## 2019-12-13 DIAGNOSIS — N183 Chronic kidney disease, stage 3 unspecified: Secondary | ICD-10-CM | POA: Diagnosis not present

## 2019-12-13 NOTE — Progress Notes (Signed)
Pulmonary Critical Care Medicine [facility] [place]   PULMONARY CRITICAL CARE SERVICE  PROGRESS NOTE  Date of Service: 12/13/2019  Charles Peters.  [id number]  DOB: [birth year]   DOA: 11/26/2019  Referring Physician: Carron Curie, MD  HPI: Charles Kozar. is a 64 y.o. male seen for follow up of Acute on Chronic Respiratory Failure.  Patient currently is decannulated on room air seems to be doing fairly well  Medications: Reviewed on Rounds  Physical Exam:  Vitals: Temperature 97.3 pulse 102 respiratory rate 17 blood pressure is 136/98 saturations 96%  Ventilator Settings off the vent on room air  . General: Comfortable at this time . Eyes: Grossly normal lids, irises & conjunctiva . ENT: grossly tongue is normal . Neck: no obvious mass . Cardiovascular: S1 S2 normal no gallop . Respiratory: No rhonchi no rales are noted at this time . Abdomen: soft . Skin: no rash seen on limited exam . Musculoskeletal: not rigid . Psychiatric:unable to assess . Neurologic: no seizure no involuntary movements         Lab Data:   Basic Metabolic Panel: Recent Labs  Lab 12/07/19 0423  NA 138  K 4.9  CL 110  CO2 18*  GLUCOSE 192*  BUN 36*  CREATININE 0.80  CALCIUM 8.5*    ABG: No results for input(s): PHART, PCO2ART, PO2ART, HCO3, O2SAT in the last 168 hours.  Liver Function Tests: No results for input(s): AST, ALT, ALKPHOS, BILITOT, PROT, ALBUMIN in the last 168 hours. No results for input(s): LIPASE, AMYLASE in the last 168 hours. No results for input(s): AMMONIA in the last 168 hours.  CBC: Recent Labs  Lab 12/07/19 0423  WBC 13.3*  NEUTROABS 10.5*  HGB 11.7*  HCT 35.0*  MCV 87.1  PLT 227    Cardiac Enzymes: No results for input(s): CKTOTAL, CKMB, CKMBINDEX, TROPONINI in the last 168 hours.  BNP (last 3 results) No results for input(s): BNP in the last 8760 hours.  ProBNP (last 3 results) No results for input(s): PROBNP in the last  8760 hours.  Radiological Exams: No results found.  Assessment/Plan Active Problems:   Acute on chronic respiratory failure with hypoxia (HCC)   COVID-19 virus infection   Acute respiratory distress syndrome (ARDS) due to 2019 novel coronavirus (HCC)   COPD, severe (HCC)   Chronic kidney disease, stage III (moderate)   1. Acute on chronic respiratory failure with hypoxia this is resolved clinically is improving we will continue with supportive care. 2. COVID-19 virus infection in resolution phase 3. ARDS treated clinically is improved 4. Severe COPD at baseline we will continue with present management 5. Chronic kidney disease stage III supportive care   I have personally seen and evaluated the patient, evaluated laboratory and imaging results, formulated the assessment and plan and placed orders. The Patient requires high complexity decision making with multiple systems involvement.  Rounds were done with the Respiratory Therapy Director and Staff therapists and discussed with nursing staff also.  Yevonne Pax, MD [facility] Pulmonary Critical Care Medicine Sleep Medicine

## 2019-12-16 LAB — CBC
HCT: 41 % (ref 39.0–52.0)
Hemoglobin: 13.1 g/dL (ref 13.0–17.0)
MCH: 28.9 pg (ref 26.0–34.0)
MCHC: 32 g/dL (ref 30.0–36.0)
MCV: 90.3 fL (ref 80.0–100.0)
Platelets: 196 10*3/uL (ref 150–400)
RBC: 4.54 MIL/uL (ref 4.22–5.81)
RDW: 18.6 % — ABNORMAL HIGH (ref 11.5–15.5)
WBC: 12.6 10*3/uL — ABNORMAL HIGH (ref 4.0–10.5)
nRBC: 0 % (ref 0.0–0.2)

## 2019-12-16 LAB — BASIC METABOLIC PANEL
Anion gap: 9 (ref 5–15)
BUN: 26 mg/dL — ABNORMAL HIGH (ref 8–23)
CO2: 26 mmol/L (ref 22–32)
Calcium: 8.5 mg/dL — ABNORMAL LOW (ref 8.9–10.3)
Chloride: 111 mmol/L (ref 98–111)
Creatinine, Ser: 0.81 mg/dL (ref 0.61–1.24)
GFR calc Af Amer: >60 mL/min (ref >60)
GFR calc non Af Amer: >60 mL/min (ref >60)
Glucose, Bld: 136 mg/dL — ABNORMAL HIGH (ref 70–99)
Potassium: 4.9 mmol/L (ref 3.5–5.1)
Sodium: 146 mmol/L — ABNORMAL HIGH (ref 135–145)

## 2019-12-17 ENCOUNTER — Other Ambulatory Visit (HOSPITAL_COMMUNITY): Payer: Medicare Other

## 2019-12-17 LAB — BLOOD GAS, ARTERIAL
Acid-Base Excess: 1.9 mmol/L (ref 0.0–2.0)
Acid-Base Excess: 1 mmol/L (ref 0.0–2.0)
Bicarbonate: 25.1 mmol/L (ref 20.0–28.0)
Bicarbonate: 28.4 mmol/L — ABNORMAL HIGH (ref 20.0–28.0)
FIO2: 80
FIO2: 100
O2 Saturation: 89.4 %
O2 Saturation: 85.3 %
Patient temperature: 37
Patient temperature: 37
pCO2 arterial: 33.3 mmHg (ref 32.0–48.0)
pCO2 arterial: 76.5 mmHg (ref 32.0–48.0)
pH, Arterial: 7.489 — ABNORMAL HIGH (ref 7.350–7.450)
pH, Arterial: 7.195 — CL (ref 7.350–7.450)
pO2, Arterial: 55 mmHg — ABNORMAL LOW (ref 83.0–108.0)
pO2, Arterial: 68.7 mmHg — ABNORMAL LOW (ref 83.0–108.0)

## 2019-12-17 MED ORDER — GENERIC EXTERNAL MEDICATION
Status: DC
Start: ? — End: 2019-12-17

## 2019-12-18 ENCOUNTER — Institutional Professional Consult (permissible substitution) (HOSPITAL_COMMUNITY): Payer: Medicare Other

## 2019-12-18 DIAGNOSIS — U071 COVID-19: Secondary | ICD-10-CM | POA: Diagnosis not present

## 2019-12-18 DIAGNOSIS — J9621 Acute and chronic respiratory failure with hypoxia: Secondary | ICD-10-CM | POA: Diagnosis not present

## 2019-12-18 DIAGNOSIS — N179 Acute kidney failure, unspecified: Secondary | ICD-10-CM | POA: Diagnosis not present

## 2019-12-18 DIAGNOSIS — T17908S Unspecified foreign body in respiratory tract, part unspecified causing other injury, sequela: Secondary | ICD-10-CM

## 2019-12-18 LAB — COMPREHENSIVE METABOLIC PANEL
ALT: 46 U/L — ABNORMAL HIGH (ref 0–44)
AST: 24 U/L (ref 15–41)
Albumin: 2.6 g/dL — ABNORMAL LOW (ref 3.5–5.0)
Alkaline Phosphatase: 65 U/L (ref 38–126)
Anion gap: 9 (ref 5–15)
BUN: 17 mg/dL (ref 8–23)
CO2: 22 mmol/L (ref 22–32)
Calcium: 8 mg/dL — ABNORMAL LOW (ref 8.9–10.3)
Chloride: 103 mmol/L (ref 98–111)
Creatinine, Ser: 0.87 mg/dL (ref 0.61–1.24)
GFR calc Af Amer: >60 mL/min (ref >60)
GFR calc non Af Amer: >60 mL/min (ref >60)
Glucose, Bld: 138 mg/dL — ABNORMAL HIGH (ref 70–99)
Potassium: 4.3 mmol/L (ref 3.5–5.1)
Sodium: 134 mmol/L — ABNORMAL LOW (ref 135–145)
Total Bilirubin: 0.5 mg/dL (ref 0.3–1.2)
Total Protein: 5.7 g/dL — ABNORMAL LOW (ref 6.5–8.1)

## 2019-12-18 LAB — CBC
HCT: 40.2 % (ref 39.0–52.0)
Hemoglobin: 12.9 g/dL — ABNORMAL LOW (ref 13.0–17.0)
MCH: 29.3 pg (ref 26.0–34.0)
MCHC: 32.1 g/dL (ref 30.0–36.0)
MCV: 91.2 fL (ref 80.0–100.0)
Platelets: 224 10*3/uL (ref 150–400)
RBC: 4.41 MIL/uL (ref 4.22–5.81)
RDW: 18.4 % — ABNORMAL HIGH (ref 11.5–15.5)
WBC: 9.9 10*3/uL (ref 4.0–10.5)
nRBC: 0 % (ref 0.0–0.2)

## 2019-12-18 LAB — BLOOD GAS, ARTERIAL
Acid-Base Excess: 0.5 mmol/L (ref 0.0–2.0)
Bicarbonate: 24.2 mmol/L (ref 20.0–28.0)
FIO2: 50
O2 Saturation: 99.3 %
Patient temperature: 36
pCO2 arterial: 34.6 mmHg (ref 32.0–48.0)
pH, Arterial: 7.453 — ABNORMAL HIGH (ref 7.350–7.450)
pO2, Arterial: 195 mmHg — ABNORMAL HIGH (ref 83.0–108.0)

## 2019-12-18 MED FILL — Medication: Qty: 1 | Status: AC

## 2019-12-18 NOTE — Progress Notes (Signed)
Pulmonary Critical Care Medicine [place]   PULMONARY CRITICAL CARE SERVICE  PROGRESS NOTE  Date of Service: 12/18/2019  Charles Peters.  WPY:[id number]  DOB: 1956/06/02   DOA: 11/26/2019  Referring Physician: Merton Border, MD  HPI: Charles Peters. is a 64 y.o. male seen for follow up of Acute on Chronic Respiratory Failure.  Patient has been not decannulated apparently had a decompensation with increased respiratory rate and increased work of breathing.  Patient was also having issues with desaturation.  He was emergently intubated found to have some sort of masslike debris in the airway which was cleared.  Patient right now is doing fairly well on the ventilator is orally intubated is requiring sedation right now and is on propofol for sedation.  Medications: Reviewed on Rounds  Physical Exam:  Vitals: Temperature 96.4 pulse 92 respiratory rate 25 blood pressure is 155/78 saturations 100%  Ventilator Settings mode of ventilation assist control FiO2 35% tidal volume 585 PEEP 5  . General: Comfortable at this time . Eyes: Grossly normal lids, irises & conjunctiva . ENT: grossly tongue is normal . Neck: no obvious mass . Cardiovascular: S1 S2 normal no gallop . Respiratory: No rhonchi no rales are noted at this time . Abdomen: soft . Skin: no rash seen on limited exam . Musculoskeletal: not rigid . Psychiatric:unable to assess . Neurologic: no seizure no involuntary movements         Lab Data:   Basic Metabolic Panel: Recent Labs  Lab 12/16/19 0547 12/17/19 2353  NA 146* 134*  K 4.9 4.3  CL 111 103  CO2 26 22  GLUCOSE 136* 138*  BUN 26* 17  CREATININE 0.81 0.87  CALCIUM 8.5* 8.0*    ABG: Recent Labs  Lab 12/17/19 1252 12/17/19 2315 12/18/19 0144  PHART 7.489* 7.195* 7.453*  PCO2ART 33.3 76.5* 34.6  PO2ART 55.0* 68.7* 195*  HCO3 25.1 28.4* 24.2  O2SAT 89.4 85.3 99.3    Liver Function Tests: Recent Labs  Lab 12/17/19 2353   AST 24  ALT 46*  ALKPHOS 65  BILITOT 0.5  PROT 5.7*  ALBUMIN 2.6*   No results for input(s): LIPASE, AMYLASE in the last 168 hours. No results for input(s): AMMONIA in the last 168 hours.  CBC: Recent Labs  Lab 12/16/19 0547 12/17/19 2353  WBC 12.6* 9.9  HGB 13.1 12.9*  HCT 41.0 40.2  MCV 90.3 91.2  PLT 196 224    Cardiac Enzymes: No results for input(s): CKTOTAL, CKMB, CKMBINDEX, TROPONINI in the last 168 hours.  BNP (last 3 results) No results for input(s): BNP in the last 8760 hours.  ProBNP (last 3 results) No results for input(s): PROBNP in the last 8760 hours.  Radiological Exams: DG CHEST PORT 1 VIEW  Result Date: 12/18/2019 CLINICAL DATA:  Respiratory failure EXAM: PORTABLE CHEST 1 VIEW COMPARISON:  12/17/2019 FINDINGS: Endotracheal tube terminates 10 mm above the carina. Lungs are clear. Low lung volumes. No pleural effusion or pneumothorax. The heart is top-normal in size.  Thoracic aortic atherosclerosis. Defibrillator pads overlying the chest. IMPRESSION: Endotracheal tube terminates 10 mm above the carina. No evidence of acute cardiopulmonary disease. Electronically Signed   By: Julian Hy M.D.   On: 12/18/2019 09:51   DG Chest Port 1 View  Result Date: 12/18/2019 CLINICAL DATA:  64 year old male intubated.  Respiratory failure. EXAM: PORTABLE CHEST 1 VIEW COMPARISON:  12/02/2019 portable chest and earlier. FINDINGS: Portable AP semi upright view at 2347 hours. Tracheostomy tube no longer  present, and endotracheal tube projects over the trachea with tip at the level the clavicles. Larger lung volumes. Stable cardiac size and mediastinal contours. Left chest pacer or resuscitation pad. Asymmetric left lower lung reticulonodular opacity appears stable from last month. Elsewhere Allowing for portable technique the lungs are clear. No pneumothorax or pleural effusion identified. IMPRESSION: 1. Tracheostomy removed and intubated with ET tube tip in good position  at the level the clavicles. 2. Asymmetric left lower lung reticulonodular opacity appears stable from last month with no new cardiopulmonary abnormality. Electronically Signed   By: Odessa Fleming M.D.   On: 12/18/2019 00:02    Assessment/Plan Active Problems:   Acute on chronic respiratory failure with hypoxia (HCC)   COVID-19 virus infection   Acute respiratory distress syndrome (ARDS) due to 2019 novel coronavirus (HCC)   COPD, severe (HCC)   Chronic kidney disease, stage III (moderate)   1. Acute on chronic respiratory failure with hypoxia plan is going to be to continue with full vent support at this time right now orally intubated the ET tube was in good position and there was some still reticulonodular densities noted in the chest x-ray the material that was taken out from the airway is clear as to what it is appears to be more of dried up secretions than anything else.  Also patient is doing much better since being intubated 2. COVID-19 infection resolved we will continue with supportive care 3. ARDS chest x-ray showing asymmetric infiltrates improving ARDS pattern we will continue to follow closely 4. Severe COPD nebulizers as necessary 5. Chronic kidney disease we will continue to monitor the patient's labs closely   I have personally seen and evaluated the patient, evaluated laboratory and imaging results, formulated the assessment and plan and placed orders. The Patient requires high complexity decision making with multiple systems involvement.  Patient is critically ill in danger of cardiac arrest and death as a need for advanced sedation and is orally intubated time 35 minutes Rounds were done with the Respiratory Therapy Director and Staff therapists and discussed with nursing staff also.  Yevonne Pax, MD [facility] Pulmonary Critical Care Medicine Sleep Medicine

## 2019-12-19 ENCOUNTER — Other Ambulatory Visit (HOSPITAL_COMMUNITY): Payer: Medicare Other

## 2019-12-19 DIAGNOSIS — U071 COVID-19: Secondary | ICD-10-CM | POA: Diagnosis not present

## 2019-12-19 DIAGNOSIS — J80 Acute respiratory distress syndrome: Secondary | ICD-10-CM | POA: Diagnosis not present

## 2019-12-19 DIAGNOSIS — N179 Acute kidney failure, unspecified: Secondary | ICD-10-CM | POA: Diagnosis not present

## 2019-12-19 DIAGNOSIS — J9621 Acute and chronic respiratory failure with hypoxia: Secondary | ICD-10-CM | POA: Diagnosis not present

## 2019-12-19 LAB — BASIC METABOLIC PANEL
Anion gap: 11 (ref 5–15)
BUN: 18 mg/dL (ref 8–23)
CO2: 24 mmol/L (ref 22–32)
Calcium: 7.9 mg/dL — ABNORMAL LOW (ref 8.9–10.3)
Chloride: 108 mmol/L (ref 98–111)
Creatinine, Ser: 0.78 mg/dL (ref 0.61–1.24)
GFR calc Af Amer: >60 mL/min (ref >60)
GFR calc non Af Amer: >60 mL/min (ref >60)
Glucose, Bld: 94 mg/dL (ref 70–99)
Potassium: 3.9 mmol/L (ref 3.5–5.1)
Sodium: 143 mmol/L (ref 135–145)

## 2019-12-19 LAB — CBC
HCT: 32.9 % — ABNORMAL LOW (ref 39.0–52.0)
Hemoglobin: 10.4 g/dL — ABNORMAL LOW (ref 13.0–17.0)
MCH: 29.1 pg (ref 26.0–34.0)
MCHC: 31.6 g/dL (ref 30.0–36.0)
MCV: 91.9 fL (ref 80.0–100.0)
Platelets: 166 10*3/uL (ref 150–400)
RBC: 3.58 MIL/uL — ABNORMAL LOW (ref 4.22–5.81)
RDW: 18.6 % — ABNORMAL HIGH (ref 11.5–15.5)
WBC: 8.9 10*3/uL (ref 4.0–10.5)
nRBC: 0 % (ref 0.0–0.2)

## 2019-12-19 MED ORDER — IOHEXOL 350 MG/ML SOLN
75.0000 mL | Freq: Once | INTRAVENOUS | Status: AC | PRN
  Administered 2019-12-19: 03:00:00 75 mL via INTRAVENOUS

## 2019-12-19 NOTE — Progress Notes (Signed)
Pulmonary Critical Care Medicine [place]   PULMONARY CRITICAL CARE SERVICE  PROGRESS NOTE  Date of Service: 12/19/2019  Charles Peters.  [id number]  DOB: 1956-03-20   DOA: 11/26/2019  Referring Physician: Merton Border, MD  HPI: Charles Peters. is a 64 y.o. male seen for follow up of Acute on Chronic Respiratory Failure.  Patient remains critically ill in danger of cardiac arrest death orally intubated with high risk airway.  His oxygenation is improving with an FiO2 having decreased down to 28%.  Check the mechanics today and try to wean  Medications: Reviewed on Rounds  Physical Exam:  Vitals: Temperature 98.0 pulse 93 respiratory 25 blood pressure is 129/62 saturations 96%  Ventilator Settings mode ventilation assist control FiO2 is 28% tidal volume was about 850 PEEP 5  . General: Comfortable at this time . Eyes: Grossly normal lids, irises & conjunctiva . ENT: grossly tongue is normal . Neck: no obvious mass . Cardiovascular: S1 S2 normal no gallop . Respiratory: No rhonchi no rales are noted at this time . Abdomen: soft . Skin: no rash seen on limited exam . Musculoskeletal: not rigid . Psychiatric:unable to assess . Neurologic: no seizure no involuntary movements         Lab Data:   Basic Metabolic Panel: Recent Labs  Lab 12/16/19 0547 12/17/19 2353 12/19/19 0440  NA 146* 134* 143  K 4.9 4.3 3.9  CL 111 103 108  CO2 26 22 24   GLUCOSE 136* 138* 94  BUN 26* 17 18  CREATININE 0.81 0.87 0.78  CALCIUM 8.5* 8.0* 7.9*    ABG: Recent Labs  Lab 12/17/19 1252 12/17/19 2315 12/18/19 0144  PHART 7.489* 7.195* 7.453*  PCO2ART 33.3 76.5* 34.6  PO2ART 55.0* 68.7* 195*  HCO3 25.1 28.4* 24.2  O2SAT 89.4 85.3 99.3    Liver Function Tests: Recent Labs  Lab 12/17/19 2353  AST 24  ALT 46*  ALKPHOS 65  BILITOT 0.5  PROT 5.7*  ALBUMIN 2.6*   No results for input(s): LIPASE, AMYLASE in the last 168 hours. No results for  input(s): AMMONIA in the last 168 hours.  CBC: Recent Labs  Lab 12/16/19 0547 12/17/19 2353 12/19/19 0440  WBC 12.6* 9.9 8.9  HGB 13.1 12.9* 10.4*  HCT 41.0 40.2 32.9*  MCV 90.3 91.2 91.9  PLT 196 224 166    Cardiac Enzymes: No results for input(s): CKTOTAL, CKMB, CKMBINDEX, TROPONINI in the last 168 hours.  BNP (last 3 results) No results for input(s): BNP in the last 8760 hours.  ProBNP (last 3 results) No results for input(s): PROBNP in the last 8760 hours.  Radiological Exams: CT ANGIO CHEST PE W OR WO CONTRAST  Result Date: 12/19/2019 CLINICAL DATA:  63 year old male with respiratory distress. Acute on chronic respiratory failure. EXAM: CT ANGIOGRAPHY CHEST WITH CONTRAST TECHNIQUE: Multidetector CT imaging of the chest was performed using the standard protocol during bolus administration of intravenous contrast. Multiplanar CT image reconstructions and MIPs were obtained to evaluate the vascular anatomy. CONTRAST:  71mL OMNIPAQUE IOHEXOL 350 MG/ML SOLN COMPARISON:  Portable chest 12/18/2019 and earlier. FINDINGS: Cardiovascular: Good contrast bolus timing in the pulmonary arterial tree. No focal filling defect identified in the pulmonary arteries to suggest acute pulmonary embolism. Calcified aortic atherosclerosis. Calcified coronary artery atherosclerosis. Mild cardiomegaly. No pericardial effusion. Mediastinum/Nodes: Prior sternotomy. No mediastinal or hilar lymphadenopathy. Lungs/Pleura: Intubated. ETT tip above the carina. Major airways are patent. Mild bilateral dependent pulmonary atelectasis. Otherwise mild generalized pulmonary ground-glass opacity.  Mild mosaic attenuation in the lower lobes is probably gas trapping. No pleural effusion. Upper Abdomen: Negative visible liver, spleen and bowel. Partially visible fatty pancreatic atrophy. Musculoskeletal: Prior sternotomy. Osteopenia. No acute osseous abnormality identified. Review of the MIP images confirms the above findings.  IMPRESSION: 1. No evidence of acute pulmonary embolus. 2. Mild generalized pulmonary ground-glass opacity and mild dependent atelectasis. No focal pneumonia. No pleural effusion. 3. Cardiomegaly. Calcified coronary artery and Aortic Atherosclerosis (ICD10-I70.0). Electronically Signed   By: Charles Peters M.D.   On: 12/19/2019 02:48   DG CHEST PORT 1 VIEW  Result Date: 12/18/2019 CLINICAL DATA:  Respiratory failure EXAM: PORTABLE CHEST 1 VIEW COMPARISON:  12/17/2019 FINDINGS: Endotracheal tube terminates 10 mm above the carina. Lungs are clear. Low lung volumes. No pleural effusion or pneumothorax. The heart is top-normal in size.  Thoracic aortic atherosclerosis. Defibrillator pads overlying the chest. IMPRESSION: Endotracheal tube terminates 10 mm above the carina. No evidence of acute cardiopulmonary disease. Electronically Signed   By: Charles Peters M.D.   On: 12/18/2019 09:51   DG Chest Port 1 View  Result Date: 12/18/2019 CLINICAL DATA:  64 year old male intubated.  Respiratory failure. EXAM: PORTABLE CHEST 1 VIEW COMPARISON:  12/02/2019 portable chest and earlier. FINDINGS: Portable AP semi upright view at 2347 hours. Tracheostomy tube no longer present, and endotracheal tube projects over the trachea with tip at the level the clavicles. Larger lung volumes. Stable cardiac size and mediastinal contours. Left chest pacer or resuscitation pad. Asymmetric left lower lung reticulonodular opacity appears stable from last month. Elsewhere Allowing for portable technique the lungs are clear. No pneumothorax or pleural effusion identified. IMPRESSION: 1. Tracheostomy removed and intubated with ET tube tip in good position at the level the clavicles. 2. Asymmetric left lower lung reticulonodular opacity appears stable from last month with no new cardiopulmonary abnormality. Electronically Signed   By: Charles Peters M.D.   On: 12/18/2019 00:02    Assessment/Plan Active Problems:   Acute on chronic respiratory  failure with hypoxia (HCC)   COVID-19 virus infection   Acute respiratory distress syndrome (ARDS) due to 2019 novel coronavirus (HCC)   COPD, severe (HCC)   Chronic kidney disease, stage III (moderate)   1. Acute on chronic respiratory failure with hypoxia plan is to continue with full support orally intubated ventilator.  Will check the mechanics today the patient did well with the spontaneous breathing trial.  And we will continue with pressure support for fast-track to hopefully extubation. 2. COVID-19 virus infection treated now in recovery phase 3. ARDS slowly improving 4. Severe COPD at baseline 5. Chronic kidney disease stage III we will continue with present management supportive care   I have personally seen and evaluated the patient, evaluated laboratory and imaging results, formulated the assessment and plan and placed orders. The Patient requires high complexity decision making with multiple systems involvement.  Time 35 minutes patient is critically ill in danger of cardiac arrest and death Rounds were done with the Respiratory Therapy Director and Staff therapists and discussed with nursing staff also.  Charles Pax, MD [facility] Pulmonary Critical Care Medicine Sleep Medicine

## 2019-12-20 DIAGNOSIS — U071 COVID-19: Secondary | ICD-10-CM | POA: Diagnosis not present

## 2019-12-20 DIAGNOSIS — K1379 Other lesions of oral mucosa: Secondary | ICD-10-CM

## 2019-12-20 DIAGNOSIS — D62 Acute posthemorrhagic anemia: Secondary | ICD-10-CM

## 2019-12-20 DIAGNOSIS — J80 Acute respiratory distress syndrome: Secondary | ICD-10-CM | POA: Diagnosis not present

## 2019-12-20 DIAGNOSIS — J9621 Acute and chronic respiratory failure with hypoxia: Secondary | ICD-10-CM | POA: Diagnosis not present

## 2019-12-20 DIAGNOSIS — N179 Acute kidney failure, unspecified: Secondary | ICD-10-CM | POA: Diagnosis not present

## 2019-12-20 LAB — CBC
HCT: 27.9 % — ABNORMAL LOW (ref 39.0–52.0)
Hemoglobin: 8.9 g/dL — ABNORMAL LOW (ref 13.0–17.0)
MCH: 29.1 pg (ref 26.0–34.0)
MCHC: 31.9 g/dL (ref 30.0–36.0)
MCV: 91.2 fL (ref 80.0–100.0)
Platelets: 162 K/uL (ref 150–400)
RBC: 3.06 MIL/uL — ABNORMAL LOW (ref 4.22–5.81)
RDW: 19 % — ABNORMAL HIGH (ref 11.5–15.5)
WBC: 8.6 K/uL (ref 4.0–10.5)
nRBC: 0.2 % (ref 0.0–0.2)

## 2019-12-20 NOTE — Progress Notes (Signed)
Pulmonary Critical Care Medicine [place]   PULMONARY CRITICAL CARE SERVICE  PROGRESS NOTE  Date of Service: 12/20/2019  Charles Peters.  [id number]  DOB: 01-09-1956   DOA: 11/26/2019  Referring Physician: Merton Border, MD  HPI: Charles Peters. is a 64 y.o. male seen for follow up of Acute on Chronic Respiratory Failure.  Patient remains critically ill is on the ventilator orally intubated.  Has been having some issues with blood loss his hemoglobin has been dropping does not appear to be clearly coming from the ET tube and I would appear to favor more of a GI source possibly spoke with the primary care team will check Hemoccult and also consider GI work-up.  If the GI work-up is unremarkable with then look at pulmonary source until this time that we know exactly what is going on with the bleeding I would hold off on extubation  Medications: Reviewed on Rounds  Physical Exam:  Vitals: Temperature 98.6 pulse 101 respiratory rate 22 blood pressure is 163/85 saturations 95%  Ventilator Settings mode ventilation pressure support FiO2 28% pressure support 10 PEEP 5 tidal line 530  . General: Comfortable at this time . Eyes: Grossly normal lids, irises & conjunctiva . ENT: grossly tongue is normal . Neck: no obvious mass . Cardiovascular: S1 S2 normal no gallop . Respiratory: No rhonchi scattered crackling noted . Abdomen: soft . Skin: no rash seen on limited exam . Musculoskeletal: not rigid . Psychiatric:unable to assess . Neurologic: no seizure no involuntary movements         Lab Data:   Basic Metabolic Panel: Recent Labs  Lab 12/16/19 0547 12/17/19 2353 12/19/19 0440  NA 146* 134* 143  K 4.9 4.3 3.9  CL 111 103 108  CO2 26 22 24   GLUCOSE 136* 138* 94  BUN 26* 17 18  CREATININE 0.81 0.87 0.78  CALCIUM 8.5* 8.0* 7.9*    ABG: Recent Labs  Lab 12/17/19 1252 12/17/19 2315 12/18/19 0144  PHART 7.489* 7.195* 7.453*  PCO2ART 33.3  76.5* 34.6  PO2ART 55.0* 68.7* 195*  HCO3 25.1 28.4* 24.2  O2SAT 89.4 85.3 99.3    Liver Function Tests: Recent Labs  Lab 12/17/19 2353  AST 24  ALT 46*  ALKPHOS 65  BILITOT 0.5  PROT 5.7*  ALBUMIN 2.6*   No results for input(s): LIPASE, AMYLASE in the last 168 hours. No results for input(s): AMMONIA in the last 168 hours.  CBC: Recent Labs  Lab 12/16/19 0547 12/17/19 2353 12/19/19 0440 12/20/19 0356  WBC 12.6* 9.9 8.9 8.6  HGB 13.1 12.9* 10.4* 8.9*  HCT 41.0 40.2 32.9* 27.9*  MCV 90.3 91.2 91.9 91.2  PLT 196 224 166 162    Cardiac Enzymes: No results for input(s): CKTOTAL, CKMB, CKMBINDEX, TROPONINI in the last 168 hours.  BNP (last 3 results) No results for input(s): BNP in the last 8760 hours.  ProBNP (last 3 results) No results for input(s): PROBNP in the last 8760 hours.  Radiological Exams: CT ANGIO CHEST PE W OR WO CONTRAST  Result Date: 12/19/2019 CLINICAL DATA:  64 year old male with respiratory distress. Acute on chronic respiratory failure. EXAM: CT ANGIOGRAPHY CHEST WITH CONTRAST TECHNIQUE: Multidetector CT imaging of the chest was performed using the standard protocol during bolus administration of intravenous contrast. Multiplanar CT image reconstructions and MIPs were obtained to evaluate the vascular anatomy. CONTRAST:  70mL OMNIPAQUE IOHEXOL 350 MG/ML SOLN COMPARISON:  Portable chest 12/18/2019 and earlier. FINDINGS: Cardiovascular: Good contrast bolus timing in  the pulmonary arterial tree. No focal filling defect identified in the pulmonary arteries to suggest acute pulmonary embolism. Calcified aortic atherosclerosis. Calcified coronary artery atherosclerosis. Mild cardiomegaly. No pericardial effusion. Mediastinum/Nodes: Prior sternotomy. No mediastinal or hilar lymphadenopathy. Lungs/Pleura: Intubated. ETT tip above the carina. Major airways are patent. Mild bilateral dependent pulmonary atelectasis. Otherwise mild generalized pulmonary ground-glass  opacity. Mild mosaic attenuation in the lower lobes is probably gas trapping. No pleural effusion. Upper Abdomen: Negative visible liver, spleen and bowel. Partially visible fatty pancreatic atrophy. Musculoskeletal: Prior sternotomy. Osteopenia. No acute osseous abnormality identified. Review of the MIP images confirms the above findings. IMPRESSION: 1. No evidence of acute pulmonary embolus. 2. Mild generalized pulmonary ground-glass opacity and mild dependent atelectasis. No focal pneumonia. No pleural effusion. 3. Cardiomegaly. Calcified coronary artery and Aortic Atherosclerosis (ICD10-I70.0). Electronically Signed   By: Odessa Fleming M.D.   On: 12/19/2019 02:48   DG Chest Port 1 View  Result Date: 12/19/2019 CLINICAL DATA:  Aspiration pneumonia. EXAM: PORTABLE CHEST 1 VIEW COMPARISON:  CT scan dated 12/19/2019 and chest x-ray dated 12/18/2019 FINDINGS: Endotracheal tube is 17 mm above the carina and could be slightly retracted. The heart size and pulmonary vascularity are normal. Aortic atherosclerosis. Median sternotomy. Lungs are clear. No effusions. No acute bone abnormality. IMPRESSION: 1. Endotracheal tube 17 mm of above the carina. This could be slightly retracted. No active cardiopulmonary disease. 2.  Aortic Atherosclerosis (ICD10-I70.0). 3. No evidence of aspiration. Electronically Signed   By: Francene Boyers M.D.   On: 12/19/2019 15:24   DG CHEST PORT 1 VIEW  Result Date: 12/18/2019 CLINICAL DATA:  Respiratory failure EXAM: PORTABLE CHEST 1 VIEW COMPARISON:  12/17/2019 FINDINGS: Endotracheal tube terminates 10 mm above the carina. Lungs are clear. Low lung volumes. No pleural effusion or pneumothorax. The heart is top-normal in size.  Thoracic aortic atherosclerosis. Defibrillator pads overlying the chest. IMPRESSION: Endotracheal tube terminates 10 mm above the carina. No evidence of acute cardiopulmonary disease. Electronically Signed   By: Charline Bills M.D.   On: 12/18/2019 09:51     Assessment/Plan Active Problems:   Acute on chronic respiratory failure with hypoxia (HCC)   COVID-19 virus infection   Acute respiratory distress syndrome (ARDS) due to 2019 novel coronavirus (HCC)   COPD, severe (HCC)   Chronic kidney disease, stage III (moderate)   1. Acute on chronic respiratory failure with hypoxia patient continues on the ventilator orally intubated with a high risk airway.  Patient not ready for extubation secondary to the issues with dropping hemoglobin. 2. Acute blood loss anemia unclear etiology likely GI will have this worked up per primary care team.  The chest x-ray that was done showed no active pulmonary disease without infiltrates etc. 3. COVID-19 virus infection and now resolution phase we will continue with supportive care 4. ARDS treated we will continue present management 5. Severe COPD nebulizers as necessary 6. Chronic kidney disease stage III we will continue to monitor labs closely   I have personally seen and evaluated the patient, evaluated laboratory and imaging results, formulated the assessment and plan and placed orders. The Patient requires high complexity decision making with multiple systems involvement.  Patient is critically ill in danger of cardiac arrest and death high risk airway time 35 minutes Rounds were done with the Respiratory Therapy Director and Staff therapists and discussed with nursing staff also.  Yevonne Pax, MD [facility] Pulmonary Critical Care Medicine Sleep Medicine

## 2019-12-20 NOTE — Consult Note (Addendum)
[facility] Consult: 4:47 PM 12/20/2019  LOS: 0 days    Referring Provider: Dr. Sharyon Medicus Primary Care Physician:  Patient, No Pcp Per Primary Gastroenterologist: G[facility] Fitz [facility]    Reason for Consultation: Blood per mouth.  Dropping Hb.   HPI: Charles Peters. is a 64 y.o. male.  History.  Patient is a morbidly obese patient with lots of background medical problems including chronic pain, COPD, stage III CKD.  Diet-controlled diabetes. Chronic pain. MI.  Obstructive sleep back apnea on CPAP and home oxygen.  Peptic ulcer disease.  DVT, not on Coumadin when he became acutely ill recently.  Surgeries include adenoidectomy, carotid endarterectomy, cholecystectomy, colonoscopy, CABG.  Hip replacements and other orthopedic surgeries. GERD.  Barrett's esophagus.  Nissen fundoplication, described as remote on barium esophagram of 11/2018.  Esophagram showed mild esophageal dysmotility.  No evidence of reflux and expected postsurgical changes following Nissen fundoplication.  MRI of abdomen from 08/2017 in care everywhere shows " In the tail of the pancreas there is a lesion which appears to be relatively high T1 postcontrast signal and moderately well circumscribed this is poorly seen on the MRI examination because of breath-holding but is seen on series 22, image 21 and series 15, image 15 measuring close to 11 mm in diameter. There is elevated signal in the diffusion-weighted images but the ADC map is not dark. There is also mildly high signal on the precontrast T1-weighted images."  Admitted to outside hospital from 11/11/2019 through 11/26/2019 at which [facility]  He was acutely hospitalized with COVID-19 pneumonia, ARDS, AKI, decubitus ulcer.   He underwent tracheostomy and feeding G-tube placement. [facility] was taken off the vent and the trach decannulated.  However he ended up having recurrent acute respiratory failure and required reintubation ~ 1/12.  After the intubation sometime last week, he developed blood coming from his mouth.  Nurse tells me this stools have been brown and that he had had a Flexi-Seal in for a while. In the past 6 days Hb has dropped steadily from 13.1 >> 8.9 today.  Platelets are good.  BUN is not elevated.  We do not have a PT/INR.  CT angio of chest yesterday, 1/14 showed groundglass opacities mild dependent atelectasis, no pneumonia, pleural effusions, PE.  There is cardiomegaly and calcifications in the coronary arteries and aortic vessels.    Past Medical History:  Diagnosis Date  . Acute on chronic respiratory failure with hypoxia (HCC)   . Acute respiratory distress syndrome (ARDS) due to 2019 novel coronavirus (HCC)   . Chronic kidney disease, stage III (moderate)   . COPD, severe (HCC)   . COVID-19 virus infection       Prior to Admission medications   Not on File    Scheduled Meds: Acyclovir Propofol drip.  Insulin.  Amantadine.  Atorvastatin.  Benztropine.  Fentanyl patch.  Lantus insulin.  Lorazepam.  Melatonin.  Metoprolol.  Modafinil.  Prednisone 20 mg daily Protonix  was on 40 mg IV daily but today it was switched to the IV drip formulation.     Allergies as of 11/26/2019  . (Not on File)    No family history on file.  Social History   Socioeconomic History  . Marital status: Married    Spouse name: Not on file  . Number of children: Not on file  . Years of education: Not on file  . Highest education level: Not on file  Occupational History  . Not on file  Tobacco Use  . Smoking status: Not on file  Substance and Sexual Activity  . Alcohol use: Not on file  . Drug use: Not on file  . Sexual activity: Not on file  Other  Topics Concern  . Not on file  Social History Narrative  . Not on file   Social Determinants of Health   Financial Resource Strain:   . Difficulty of Paying Living Expenses: Not on file  Food Insecurity:   . Worried About Programme researcher, broadcasting/film/video in the Last Year: Not on file  . Ran Out of Food in the Last Year: Not on file  Transportation Needs:   . Lack of Transportation (Medical): Not on file  . Lack of Transportation (Non-Medical): Not on file  Physical Activity:   . Days of Exercise per Week: Not on file  . Minutes of Exercise per Session: Not on file  Stress:   . Feeling of Stress : Not on file  Social Connections:   . Frequency of Communication with Friends and Family: Not on file  . Frequency of Social Gatherings with Friends and Family: Not on file  . Attends Religious Services: Not on file  . Active Member of Clubs or Organizations: Not on file  . Attends Banker Meetings: Not on file  . Marital Status: Not on file  Intimate Partner Violence:   . Fear of Current or Ex-Partner: Not on file  . Emotionally Abused: Not on file  . Physically Abused: Not on file  . Sexually Abused: Not on file    REVIEW OF SYSTEMS: Patient is sedated with propofol  PHYSICAL EXAM: Vital signs in last 24 hours: Temperature 99.8 F.   Heart rate 104   blood pressure 161/66   saturations 98%.  Respirations 16. General: Looks ill.  Obese.  Unresponsive. Head: No facial asymmetry. Eyes: No scleral icterus or conjunctival pallor. Ears: Unable to assess hearing Nose: No discharge or congestion. Mouth: There is a small amount of blood when I run my finger in through his mouth but on a suboptimal survey of his oral cavity I do not see any obvious sources of bleeding or significant blood.  There is blood in the bedside canister that has been suctioned from his mouth and it contains about 250 cc of fresh red blood. Neck: Tracheostomy scar is hardly visible.  Obese. Lungs: Breathing  quietly on the vent. Heart: RRR. Abdomen: Soft.  Obese.  PEG site in the upper right abdomen is benign..   Rectal: DRE with medium to light brown liquid stool, did not send for Hemoccult but there is no obvious blood in it does not look melenic. Musc/Skeltl: No obvious joint redness or swelling. Extremities: Nonpitting lower extremity edema. Neurologic: Sedated, unresponsive.  Did not use noxious stimuli to attempt to arouse him. Skin: No obvious rashes or suspicious lesions.   Intake/Output from previous day: No intake/output data recorded. Intake/Output this shift: No intake/output data recorded.  LAB RESULTS:  Recent Labs    12/17/19 2353 12/19/19 0440 12/20/19 0356  WBC 9.9 8.9 8.6  HGB 12.9* 10.4* 8.9*  HCT 40.2 32.9* 27.9*  PLT 224 166 162   BMET Lab Results  Component Value Date   NA 143 12/19/2019   NA 134 (L) 12/17/2019   NA 146 (H) 12/16/2019   K 3.9 12/19/2019   K 4.3 12/17/2019   K 4.9 12/16/2019   CL 108 12/19/2019   CL 103 12/17/2019   CL 111 12/16/2019   CO2 24 12/19/2019   CO2 22 12/17/2019   CO2 26 12/16/2019   GLUCOSE 94 12/19/2019   GLUCOSE 138 (H) 12/17/2019   GLUCOSE 136 (H) 12/16/2019   BUN 18 12/19/2019   BUN 17 12/17/2019   BUN 26 (H) 12/16/2019   CREATININE 0.78 12/19/2019   CREATININE 0.87 12/17/2019   CREATININE 0.81 12/16/2019   CALCIUM 7.9 (L) 12/19/2019   CALCIUM 8.0 (L) 12/17/2019   CALCIUM 8.5 (L) 12/16/2019   LFT Recent Labs    12/17/19 2353  PROT 5.7*  ALBUMIN 2.6*  AST 24  ALT 46*  ALKPHOS 65  BILITOT 0.5   PT/INR No results found for: INR, PROTIME Hepatitis Panel No results for input(s): HEPBSAG, HCVAB, HEPAIGM, HEPBIGM in the last 72 hours. C-Diff No components found for: CDIFF Lipase  No results found for: LIPASE  Drugs of Abuse  No results found for: LABOPIA, COCAINSCRNUR, LABBENZ, AMPHETMU, THCU, LABBARB   RADIOLOGY STUDIES: CT ANGIO CHEST PE W OR WO CONTRAST  Result Date: 12/19/2019 CLINICAL DATA:   64 year old male with respiratory distress. Acute on chronic respiratory failure. EXAM: CT ANGIOGRAPHY CHEST WITH CONTRAST TECHNIQUE: Multidetector CT imaging of the chest was performed using the standard protocol during bolus administration of intravenous contrast. Multiplanar CT image reconstructions and MIPs were obtained to evaluate the vascular anatomy. CONTRAST:  66mL OMNIPAQUE IOHEXOL 350 MG/ML SOLN COMPARISON:  Portable chest 12/18/2019 and earlier. FINDINGS: Cardiovascular: Good contrast bolus timing in the pulmonary arterial tree. No focal filling defect identified in the pulmonary arteries to suggest acute pulmonary embolism. Calcified aortic atherosclerosis. Calcified coronary artery atherosclerosis. Mild cardiomegaly. No pericardial effusion. Mediastinum/Nodes: Prior sternotomy. No mediastinal or hilar lymphadenopathy. Lungs/Pleura: Intubated. ETT tip above the carina. Major airways are patent. Mild bilateral dependent pulmonary atelectasis. Otherwise mild generalized pulmonary ground-glass opacity. Mild mosaic attenuation in the lower lobes is probably gas trapping. No pleural effusion. Upper Abdomen: Negative visible liver, spleen and bowel. Partially visible fatty pancreatic atrophy. Musculoskeletal: Prior sternotomy. Osteopenia. No acute osseous abnormality identified. Review of the MIP images confirms the above findings. IMPRESSION: 1. No evidence of acute pulmonary embolus. 2. Mild generalized pulmonary ground-glass opacity and mild dependent atelectasis. No focal pneumonia. No pleural effusion. 3. Cardiomegaly. Calcified coronary artery and Aortic Atherosclerosis (ICD10-I70.0). Electronically Signed   By: Genevie Ann M.D.   On: 12/19/2019 02:48   DG Chest Port 1 View  Result Date: 12/19/2019 CLINICAL DATA:  Aspiration pneumonia. EXAM: PORTABLE CHEST 1 VIEW COMPARISON:  CT scan dated 12/19/2019 and chest x-ray dated 12/18/2019 FINDINGS: Endotracheal tube is 17 mm above the carina and could be  slightly retracted. The heart size and pulmonary vascularity are normal. Aortic atherosclerosis. Median sternotomy. Lungs are clear. No effusions. No acute bone abnormality. IMPRESSION: 1. Endotracheal tube 17 mm of above the carina. This could be slightly retracted. No active cardiopulmonary disease. 2.  Aortic Atherosclerosis (ICD10-I70.0). 3. No evidence of aspiration. Electronically Signed   By: Lorriane Shire M.D.   On: 12/19/2019  15:24     IMPRESSION:   *   Blood per mouth following intubation.  Suspect some sort of intubation related trauma though could not locate anything in the mouth or throat on a suboptimal exam.  *    Steady decline of Hb.  *     COVID-19 pneumonia with complications admitted to outside hospital 12/7 -4/22.  Tracheostomy and PEG placement there.  Was off vent and trach decannulated but developed recurrent respiratory failure ~1/12 and reintubated/ vent.  Concern was for aspiration.  Chest x-ray from yesterday shows no active cardiopulmonary disease and no evidence of aspiration.    PLAN:     *    EGD?Marland Kitchen Ideally it would be nice to get a thorough oral pharyngeal exam without resorting to rule out bleeding lesion.  *   Did not aspirate PEG, this might be helpful as well.     Jennye Moccasin  12/20/2019, 4:47 PM Phone [phone]

## 2019-12-21 DIAGNOSIS — U071 COVID-19: Secondary | ICD-10-CM | POA: Diagnosis not present

## 2019-12-21 DIAGNOSIS — N183 Chronic kidney disease, stage 3 unspecified: Secondary | ICD-10-CM | POA: Diagnosis not present

## 2019-12-21 DIAGNOSIS — J9621 Acute and chronic respiratory failure with hypoxia: Secondary | ICD-10-CM | POA: Diagnosis not present

## 2019-12-21 DIAGNOSIS — N179 Acute kidney failure, unspecified: Secondary | ICD-10-CM | POA: Diagnosis not present

## 2019-12-21 LAB — BASIC METABOLIC PANEL
Anion gap: 8 (ref 5–15)
BUN: 12 mg/dL (ref 8–23)
CO2: 23 mmol/L (ref 22–32)
Calcium: 7.9 mg/dL — ABNORMAL LOW (ref 8.9–10.3)
Chloride: 111 mmol/L (ref 98–111)
Creatinine, Ser: 0.73 mg/dL (ref 0.61–1.24)
GFR calc Af Amer: >60 mL/min (ref >60)
GFR calc non Af Amer: >60 mL/min (ref >60)
Glucose, Bld: 154 mg/dL — ABNORMAL HIGH (ref 70–99)
Potassium: 3.4 mmol/L — ABNORMAL LOW (ref 3.5–5.1)
Sodium: 142 mmol/L (ref 135–145)

## 2019-12-21 LAB — CBC
HCT: 29.3 % — ABNORMAL LOW (ref 39.0–52.0)
Hemoglobin: 9.2 g/dL — ABNORMAL LOW (ref 13.0–17.0)
MCH: 28.9 pg (ref 26.0–34.0)
MCHC: 31.4 g/dL (ref 30.0–36.0)
MCV: 92.1 fL (ref 80.0–100.0)
Platelets: 171 10*3/uL (ref 150–400)
RBC: 3.18 MIL/uL — ABNORMAL LOW (ref 4.22–5.81)
RDW: 19.3 % — ABNORMAL HIGH (ref 11.5–15.5)
WBC: 7.2 10*3/uL (ref 4.0–10.5)
nRBC: 0.6 % — ABNORMAL HIGH (ref 0.0–0.2)

## 2019-12-21 NOTE — Progress Notes (Addendum)
Pulmonary Critical Care Medicine [place]   PULMONARY CRITICAL CARE SERVICE  PROGRESS NOTE  Date of Service: 12/21/2019  Charles Peters.  [id number]  DOB: [birth year]   DOA: 11/26/2019  Referring Physician: Merton Border, MD  HPI: Charles Peters. is a 64 y.o. male seen for follow up of Acute on Chronic Respiratory Failure.  Patient remains orally intubated at this time on pressure support as tolerated.  Has been on 12/5 and FiO2 35% since this morning.  Medications: Reviewed on Rounds  Physical Exam:  Vitals: Pulse 114 respirations 15 BP 149/75 O2 sat 98% temp 97.8  Ventilator Settings pressure support 12/5 FiO2 35%  . General: Comfortable at this time . Eyes: Grossly normal lids, irises & conjunctiva . ENT: grossly tongue is normal . Neck: no obvious mass . Cardiovascular: S1 S2 normal no gallop . Respiratory: No rales or rhonchi noted . Abdomen: soft . Skin: no rash seen on limited exam . Musculoskeletal: not rigid . Psychiatric:unable to assess . Neurologic: no seizure no involuntary movements         Lab Data:   Basic Metabolic Panel: Recent Labs  Lab 12/16/19 0547 12/17/19 2353 12/19/19 0440 12/21/19 0701  NA 146* 134* 143 142  K 4.9 4.3 3.9 3.4*  CL 111 103 108 111  CO2 26 22 24 23   GLUCOSE 136* 138* 94 154*  BUN 26* 17 18 12   CREATININE 0.81 0.87 0.78 0.73  CALCIUM 8.5* 8.0* 7.9* 7.9*    ABG: Recent Labs  Lab 12/17/19 1252 12/17/19 2315 12/18/19 0144  PHART 7.489* 7.195* 7.453*  PCO2ART 33.3 76.5* 34.6  PO2ART 55.0* 68.7* 195*  HCO3 25.1 28.4* 24.2  O2SAT 89.4 85.3 99.3    Liver Function Tests: Recent Labs  Lab 12/17/19 2353  AST 24  ALT 46*  ALKPHOS 65  BILITOT 0.5  PROT 5.7*  ALBUMIN 2.6*   No results for input(s): LIPASE, AMYLASE in the last 168 hours. No results for input(s): AMMONIA in the last 168 hours.  CBC: Recent Labs  Lab 12/16/19 0547 12/17/19 2353 12/19/19 0440 12/20/19 0356  12/21/19 0701  WBC 12.6* 9.9 8.9 8.6 7.2  HGB 13.1 12.9* 10.4* 8.9* 9.2*  HCT 41.0 40.2 32.9* 27.9* 29.3*  MCV 90.3 91.2 91.9 91.2 92.1  PLT 196 224 166 162 171    Cardiac Enzymes: No results for input(s): CKTOTAL, CKMB, CKMBINDEX, TROPONINI in the last 168 hours.  BNP (last 3 results) No results for input(s): BNP in the last 8760 hours.  ProBNP (last 3 results) No results for input(s): PROBNP in the last 8760 hours.  Radiological Exams: No results found.  Assessment/Plan Active Problems:   Acute on chronic respiratory failure with hypoxia (HCC)   COVID-19 virus infection   Acute respiratory distress syndrome (ARDS) due to 2019 novel coronavirus (HCC)   COPD, severe (HCC)   Chronic kidney disease, stage III (moderate)   1. Acute on chronic respiratory failure with hypoxia patient continues on the ventilator orally intubated with a high risk airway.  Patient not ready for extubation secondary to the issues with dropping hemoglobin.  Currently doing well on pressure support continue to monitor.  Continue supportive measures and pulmonary toilet. 2. Acute blood loss anemia unclear etiology likely GI will have this worked up per primary care team.  The chest x-ray that was done showed no active pulmonary disease without infiltrates etc. 3. COVID-19 virus infection and now resolution phase we will continue with supportive care 4. ARDS treated  we will continue present management 5. Severe COPD nebulizers as necessary 6. Chronic kidney disease stage III we will continue to monitor labs closely   I have personally seen and evaluated the patient, evaluated laboratory and imaging results, formulated the assessment and plan and placed orders. The Patient requires high complexity decision making with multiple systems involvement.  Rounds were done with the Respiratory Therapy Director and Staff therapists and discussed with nursing staff also.  Yevonne Pax, MD [facility] Pulmonary Critical  Care Medicine Sleep Medicine

## 2019-12-22 DIAGNOSIS — J80 Acute respiratory distress syndrome: Secondary | ICD-10-CM | POA: Diagnosis not present

## 2019-12-22 DIAGNOSIS — J9621 Acute and chronic respiratory failure with hypoxia: Secondary | ICD-10-CM | POA: Diagnosis not present

## 2019-12-22 DIAGNOSIS — N179 Acute kidney failure, unspecified: Secondary | ICD-10-CM | POA: Diagnosis not present

## 2019-12-22 DIAGNOSIS — U071 COVID-19: Secondary | ICD-10-CM | POA: Diagnosis not present

## 2019-12-22 LAB — OCCULT BLOOD X 1 CARD TO LAB, STOOL: Fecal Occult Bld: NEGATIVE

## 2019-12-22 LAB — POTASSIUM: Potassium: 4.2 mmol/L (ref 3.5–5.1)

## 2019-12-22 NOTE — Progress Notes (Addendum)
Pulmonary Critical Care Medicine [facility] [place]   PULMONARY CRITICAL CARE SERVICE  PROGRESS NOTE  Date of Service: 12/22/2019  Charles Peters.  [id number]:[id number]  DOB: [birth year]   DOA: 11/26/2019  Referring Physician: Carron Curie, MD  HPI: Charles Peters. is a 64 y.o. male seen for follow up of Acute on Chronic Respiratory Failure.  Patient currently remains orally intubated is on pressure support mode has been on 35% FiO2 10/5 to be tolerating well so far  Medications: Reviewed on Rounds  Physical Exam:  Vitals: Temperature 97.8 pulse 83 respiratory rate 23 blood pressure is 106/55 saturations 99%  Ventilator Settings mode of ventilation pressure support FiO2 35% tidal volume 664 per support 10 PEEP 5  . General: Comfortable at this time . Eyes: Grossly normal lids, irises & conjunctiva . ENT: grossly tongue is normal . Neck: no obvious mass . Cardiovascular: S1 S2 normal no gallop . Respiratory: No rhonchi no rales are noted at this time . Abdomen: soft . Skin: no rash seen on limited exam . Musculoskeletal: not rigid . Psychiatric:unable to assess . Neurologic: no seizure no involuntary movements         Lab Data:   Basic Metabolic Panel: Recent Labs  Lab 12/16/19 0547 12/17/19 2353 12/19/19 0440 12/21/19 0701  NA 146* 134* 143 142  K 4.9 4.3 3.9 3.4*  CL 111 103 108 111  CO2 26 22 24 23   GLUCOSE 136* 138* 94 154*  BUN 26* 17 18 12   CREATININE 0.81 0.87 0.78 0.73  CALCIUM 8.5* 8.0* 7.9* 7.9*    ABG: Recent Labs  Lab 12/17/19 1252 12/17/19 2315 12/18/19 0144  PHART 7.489* 7.195* 7.453*  PCO2ART 33.3 76.5* 34.6  PO2ART 55.0* 68.7* 195*  HCO3 25.1 28.4* 24.2  O2SAT 89.4 85.3 99.3    Liver Function Tests: Recent Labs  Lab 12/17/19 2353  AST 24  ALT 46*  ALKPHOS 65  BILITOT 0.5  PROT 5.7*  ALBUMIN 2.6*   No results for input(s): LIPASE, AMYLASE in the last 168 hours. No results for input(s): AMMONIA in the last 168  hours.  CBC: Recent Labs  Lab 12/16/19 0547 12/17/19 2353 12/19/19 0440 12/20/19 0356 12/21/19 0701  WBC 12.6* 9.9 8.9 8.6 7.2  HGB 13.1 12.9* 10.4* 8.9* 9.2*  HCT 41.0 40.2 32.9* 27.9* 29.3*  MCV 90.3 91.2 91.9 91.2 92.1  PLT 196 224 166 162 171    Cardiac Enzymes: No results for input(s): CKTOTAL, CKMB, CKMBINDEX, TROPONINI in the last 168 hours.  BNP (last 3 results) No results for input(s): BNP in the last 8760 hours.  ProBNP (last 3 results) No results for input(s): PROBNP in the last 8760 hours.  Radiological Exams: No results found.  Assessment/Plan Active Problems:   Acute on chronic respiratory failure with hypoxia (HCC)   COVID-19 virus infection   Acute respiratory distress syndrome (ARDS) due to 2019 novel coronavirus (HCC)   COPD, severe (HCC)   Chronic kidney disease, stage III (moderate)   1. Acute on chronic respiratory failure with hypoxia plan is to continue to wean on pressure support as tolerated currently on 35% FiO2 with a volume of 664 we may consider extubation in the coming week 2. COVID-19 virus infection in recovery phase we will continue with supportive care 3. ARDS treated slow to resolve we will continue to follow 4. Severe COPD at baseline 5. Chronic kidney disease no change we will continue with supportive care   I have personally seen and evaluated  the patient, evaluated laboratory and imaging results, formulated the assessment and plan and placed orders. The Patient requires high complexity decision making with multiple systems involvement.  Patient critically ill in danger of cardiac arrest and death time 35 minutes Rounds were done with the Respiratory Therapy Director and Staff therapists and discussed with nursing staff also.  Allyne Gee, MD [facility] Pulmonary Critical Care Medicine Sleep Medicine

## 2019-12-23 DIAGNOSIS — N179 Acute kidney failure, unspecified: Secondary | ICD-10-CM | POA: Diagnosis not present

## 2019-12-23 DIAGNOSIS — J80 Acute respiratory distress syndrome: Secondary | ICD-10-CM | POA: Diagnosis not present

## 2019-12-23 DIAGNOSIS — J9621 Acute and chronic respiratory failure with hypoxia: Secondary | ICD-10-CM | POA: Diagnosis not present

## 2019-12-23 DIAGNOSIS — U071 COVID-19: Secondary | ICD-10-CM | POA: Diagnosis not present

## 2019-12-23 LAB — CBC
HCT: 27 % — ABNORMAL LOW (ref 39.0–52.0)
Hemoglobin: 8.3 g/dL — ABNORMAL LOW (ref 13.0–17.0)
MCH: 29.1 pg (ref 26.0–34.0)
MCHC: 30.7 g/dL (ref 30.0–36.0)
MCV: 94.7 fL (ref 80.0–100.0)
Platelets: 194 10*3/uL (ref 150–400)
RBC: 2.85 MIL/uL — ABNORMAL LOW (ref 4.22–5.81)
RDW: 19.8 % — ABNORMAL HIGH (ref 11.5–15.5)
WBC: 9.1 10*3/uL (ref 4.0–10.5)
nRBC: 0.3 % — ABNORMAL HIGH (ref 0.0–0.2)

## 2019-12-23 LAB — BASIC METABOLIC PANEL
Anion gap: 9 (ref 5–15)
BUN: 18 mg/dL (ref 8–23)
CO2: 20 mmol/L — ABNORMAL LOW (ref 22–32)
Calcium: 7.7 mg/dL — ABNORMAL LOW (ref 8.9–10.3)
Chloride: 113 mmol/L — ABNORMAL HIGH (ref 98–111)
Creatinine, Ser: 0.69 mg/dL (ref 0.61–1.24)
GFR calc Af Amer: >60 mL/min (ref >60)
GFR calc non Af Amer: >60 mL/min (ref >60)
Glucose, Bld: 127 mg/dL — ABNORMAL HIGH (ref 70–99)
Potassium: 3.9 mmol/L (ref 3.5–5.1)
Sodium: 142 mmol/L (ref 135–145)

## 2019-12-23 NOTE — Progress Notes (Signed)
Pulmonary Critical Care Medicine [facility] [place]   PULMONARY CRITICAL CARE SERVICE  PROGRESS NOTE  Date of Service: 12/23/2019  Charles Peters.  [id number]  DOB: Aug 07, 1956   DOA: 11/26/2019  Referring Physician: Carron Curie, MD  HPI: Charles Peters. is a 64 y.o. male seen for follow up of Acute on Chronic Respiratory Failure.  Patient currently is on pressure support mode has been on 35% FiO2 the patient has completed 24 hours on pressure support 10/5 remains orally intubated.  Patient is ready for extubation  Medications: Reviewed on Rounds  Physical Exam:  Vitals: Temperature 98.1 pulse 84 respiratory 19 blood pressure is 143/66 saturations 94%  Ventilator Settings orally intubated mode of ventilation pressure support FiO2 35% tidal line 385 pressure poor 10 PEEP 5  . General: Comfortable at this time . Eyes: Grossly normal lids, irises & conjunctiva . ENT: grossly tongue is normal . Neck: no obvious mass . Cardiovascular: S1 S2 normal no gallop . Respiratory: No rhonchi no rales are noted at this time . Abdomen: soft . Skin: no rash seen on limited exam . Musculoskeletal: not rigid . Psychiatric:unable to assess . Neurologic: no seizure no involuntary movements         Lab Data:   Basic Metabolic Panel: Recent Labs  Lab 12/17/19 2353 12/19/19 0440 12/21/19 0701 12/22/19 1651 12/23/19 0538  NA 134* 143 142  --  142  K 4.3 3.9 3.4* 4.2 3.9  CL 103 108 111  --  113*  CO2 22 24 23   --  20*  GLUCOSE 138* 94 154*  --  127*  BUN 17 18 12   --  18  CREATININE 0.87 0.78 0.73  --  0.69  CALCIUM 8.0* 7.9* 7.9*  --  7.7*    ABG: Recent Labs  Lab 12/17/19 1252 12/17/19 2315 12/18/19 0144  PHART 7.489* 7.195* 7.453*  PCO2ART 33.3 76.5* 34.6  PO2ART 55.0* 68.7* 195*  HCO3 25.1 28.4* 24.2  O2SAT 89.4 85.3 99.3    Liver Function Tests: Recent Labs  Lab 12/17/19 2353  AST 24  ALT 46*  ALKPHOS 65  BILITOT 0.5  PROT 5.7*  ALBUMIN  2.6*   No results for input(s): LIPASE, AMYLASE in the last 168 hours. No results for input(s): AMMONIA in the last 168 hours.  CBC: Recent Labs  Lab 12/17/19 2353 12/19/19 0440 12/20/19 0356 12/21/19 0701 12/23/19 0538  WBC 9.9 8.9 8.6 7.2 9.1  HGB 12.9* 10.4* 8.9* 9.2* 8.3*  HCT 40.2 32.9* 27.9* 29.3* 27.0*  MCV 91.2 91.9 91.2 92.1 94.7  PLT 224 166 162 171 194    Cardiac Enzymes: No results for input(s): CKTOTAL, CKMB, CKMBINDEX, TROPONINI in the last 168 hours.  BNP (last 3 results) No results for input(s): BNP in the last 8760 hours.  ProBNP (last 3 results) No results for input(s): PROBNP in the last 8760 hours.  Radiological Exams: No results found.  Assessment/Plan Active Problems:   Acute on chronic respiratory failure with hypoxia (HCC)   COVID-19 virus infection   Acute respiratory distress syndrome (ARDS) due to 2019 novel coronavirus (HCC)   COPD, severe (HCC)   Chronic kidney disease, stage III (moderate)   1. Acute on chronic respiratory failure with hypoxia patient will be extubated will extubate the patient to BiPAP. 2. COVID-19 virus infection treated we will continue with supportive care 3. ARDS slowly improving we will continue to follow 4. Severe COPD plan is to extubate to BiPAP 5. Chronic kidney disease  stage III follow labs   I have personally seen and evaluated the patient, evaluated laboratory and imaging results, formulated the assessment and plan and placed orders. The Patient requires high complexity decision making with multiple systems involvement.  Time 35 minutes complex decision extubation Rounds were done with the Respiratory Therapy Director and Staff therapists and discussed with nursing staff also.  Allyne Gee, MD [facility] Pulmonary Critical Care Medicine Sleep Medicine

## 2019-12-24 DIAGNOSIS — J9621 Acute and chronic respiratory failure with hypoxia: Secondary | ICD-10-CM | POA: Diagnosis not present

## 2019-12-24 DIAGNOSIS — N179 Acute kidney failure, unspecified: Secondary | ICD-10-CM | POA: Diagnosis not present

## 2019-12-24 DIAGNOSIS — U071 COVID-19: Secondary | ICD-10-CM | POA: Diagnosis not present

## 2019-12-24 DIAGNOSIS — N183 Chronic kidney disease, stage 3 unspecified: Secondary | ICD-10-CM | POA: Diagnosis not present

## 2019-12-24 NOTE — Progress Notes (Signed)
Pulmonary Critical Care Medicine [facility] [place]   PULMONARY CRITICAL CARE SERVICE  PROGRESS NOTE  Date of Service: 12/24/2019  Charles Peters.  ZOX:[id number]  DOB: [birth year]   DOA: 11/26/2019  Referring Physician: Carron Curie, MD  HPI: Charles Peters. is a 64 y.o. male seen for follow up of Acute on Chronic Respiratory Failure.  Patient is extubated right now is on 2 L oxygen has been on BiPAP overnight  Medications: Reviewed on Rounds  Physical Exam:  Vitals: Temperature 97.1 pulse 96 respiratory rate 20 blood pressure is 173/82 saturations 100%  Ventilator Settings on 2 L oxygen  . General: Comfortable at this time . Eyes: Grossly normal lids, irises & conjunctiva . ENT: grossly tongue is normal . Neck: no obvious mass . Cardiovascular: S1 S2 normal no gallop . Respiratory: No rhonchi no rales are noted at this time . Abdomen: soft . Skin: no rash seen on limited exam . Musculoskeletal: not rigid . Psychiatric:unable to assess . Neurologic: no seizure no involuntary movements         Lab Data:   Basic Metabolic Panel: Recent Labs  Lab 12/17/19 2353 12/19/19 0440 12/21/19 0701 12/22/19 1651 12/23/19 0538  NA 134* 143 142  --  142  K 4.3 3.9 3.4* 4.2 3.9  CL 103 108 111  --  113*  CO2 22 24 23   --  20*  GLUCOSE 138* 94 154*  --  127*  BUN 17 18 12   --  18  CREATININE 0.87 0.78 0.73  --  0.69  CALCIUM 8.0* 7.9* 7.9*  --  7.7*    ABG: Recent Labs  Lab 12/17/19 1252 12/17/19 2315 12/18/19 0144  PHART 7.489* 7.195* 7.453*  PCO2ART 33.3 76.5* 34.6  PO2ART 55.0* 68.7* 195*  HCO3 25.1 28.4* 24.2  O2SAT 89.4 85.3 99.3    Liver Function Tests: Recent Labs  Lab 12/17/19 2353  AST 24  ALT 46*  ALKPHOS 65  BILITOT 0.5  PROT 5.7*  ALBUMIN 2.6*   No results for input(s): LIPASE, AMYLASE in the last 168 hours. No results for input(s): AMMONIA in the last 168 hours.  CBC: Recent Labs  Lab 12/17/19 2353 12/19/19 0440  12/20/19 0356 12/21/19 0701 12/23/19 0538  WBC 9.9 8.9 8.6 7.2 9.1  HGB 12.9* 10.4* 8.9* 9.2* 8.3*  HCT 40.2 32.9* 27.9* 29.3* 27.0*  MCV 91.2 91.9 91.2 92.1 94.7  PLT 224 166 162 171 194    Cardiac Enzymes: No results for input(s): CKTOTAL, CKMB, CKMBINDEX, TROPONINI in the last 168 hours.  BNP (last 3 results) No results for input(s): BNP in the last 8760 hours.  ProBNP (last 3 results) No results for input(s): PROBNP in the last 8760 hours.  Radiological Exams: No results found.  Assessment/Plan Active Problems:   Acute on chronic respiratory failure with hypoxia (HCC)   COVID-19 virus infection   Acute respiratory distress syndrome (ARDS) due to 2019 novel coronavirus (HCC)   COPD, severe (HCC)   Chronic kidney disease, stage III (moderate)   1. Acute on chronic respiratory failure with hypoxia plan is to continue with supportive care right now on 2 L oxygen continue secretion management pulmonary toilet 2. COVID-19 virus infection and resolving 3. ARDS clinically improving 4. Severe COPD at baseline 5. Chronic kidney disease stage III we will continue with present management   I have personally seen and evaluated the patient, evaluated laboratory and imaging results, formulated the assessment and plan and placed orders. The Patient requires  high complexity decision making with multiple systems involvement.  Rounds were done with the Respiratory Therapy Director and Staff therapists and discussed with nursing staff also.  Allyne Gee, MD [facility] Pulmonary Critical Care Medicine Sleep Medicine

## 2019-12-27 LAB — NOVEL CORONAVIRUS, NAA (HOSP ORDER, SEND-OUT TO REF LAB; TAT 18-24 HRS): SARS-CoV-2, NAA: NOT DETECTED

## 2021-12-21 IMAGING — DX DG CHEST 1V PORT
1 series · 1 of 1 positions shown · non-contrast
Comparison: None.

CLINICAL DATA: ARDS

EXAM:
PORTABLE CHEST 1 VIEW

[chest ap]
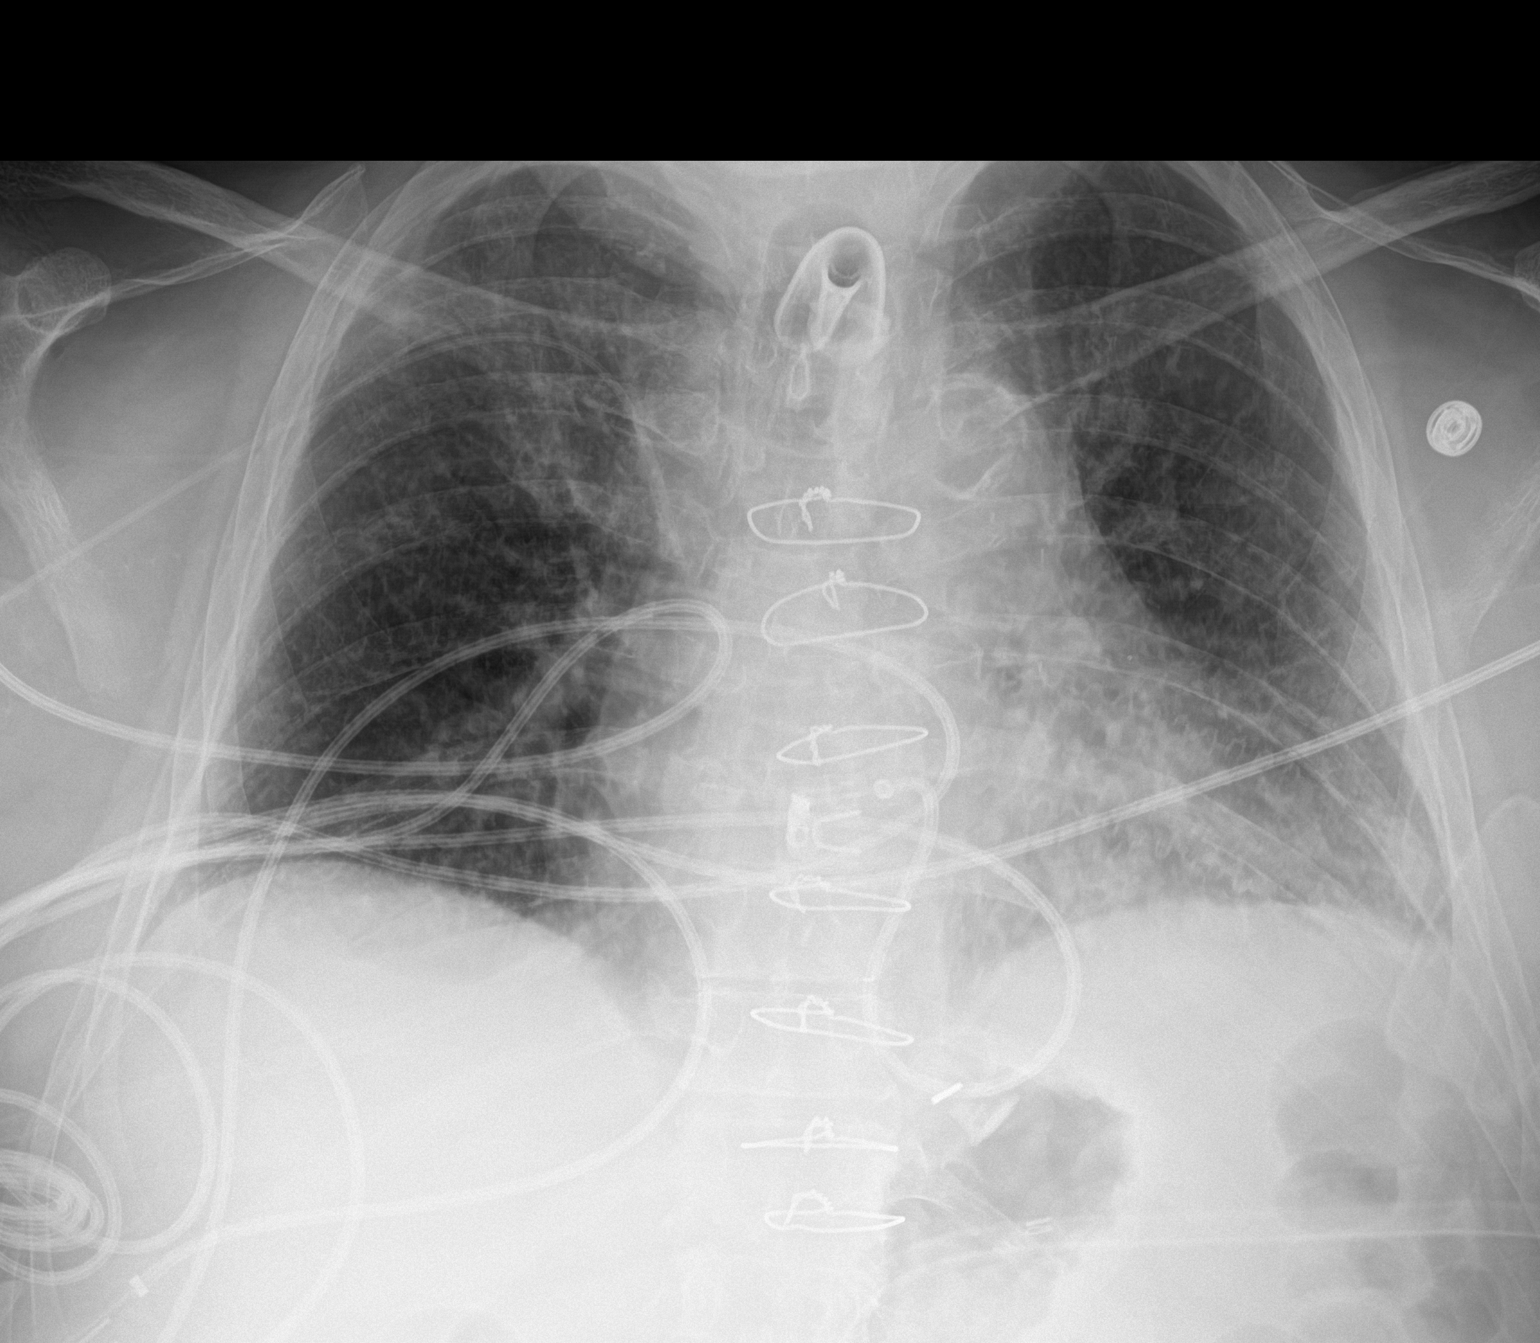

[1 of 1 positions shown; findings below may reference images not displayed]

FINDINGS: Tracheostomy tube projects over the tracheal air shadow. Reverse
lordotic projection.

Prior median sternotomy.

Low lung volumes are present, causing crowding of the pulmonary
vasculature. Equivocal interstitial accentuation at the left lung
base but no discrete airspace opacity. No definite blunting of the
costophrenic angles.

Left heart border indistinct, borderline appearance for
cardiomegaly. Right central line tip: SVC.
IMPRESSION: 1. Equivocal interstitial accentuation at the left lung base.
2. Tracheostomy tube appears satisfactorily positioned.
3. Borderline cardiomegaly.
4. Low lung volumes.
5. Prior median sternotomy.
6. Right central line tip: SVC.

## 2021-12-25 IMAGING — DX DG ABD PORTABLE 1V
1 series · 2 of 2 positions shown · non-contrast
Comparison: November 26, 2019

CLINICAL DATA: Assess bowel status.

EXAM:
PORTABLE ABDOMEN - 1 VIEW

[Series 1: abdomen · 0.14mm/px · 2 of 2 slices shown]
[im 1/2]
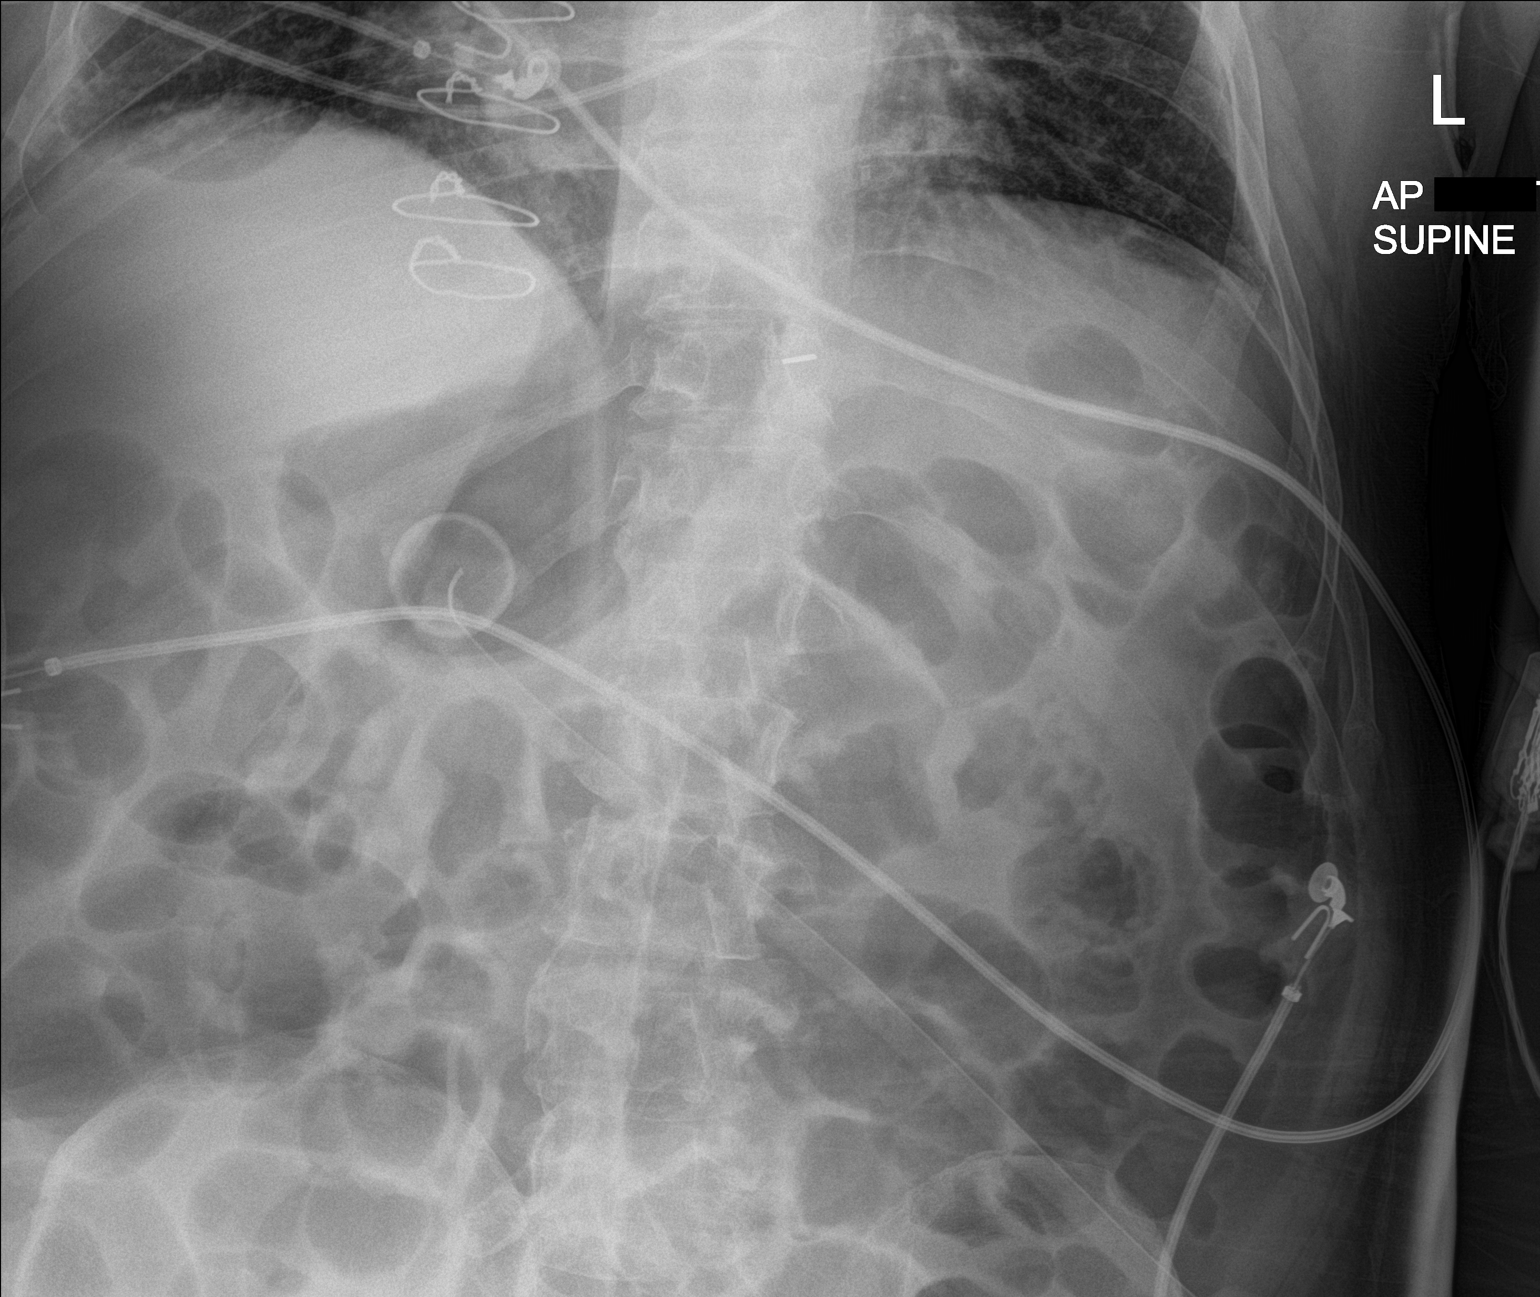
[im 2/2]
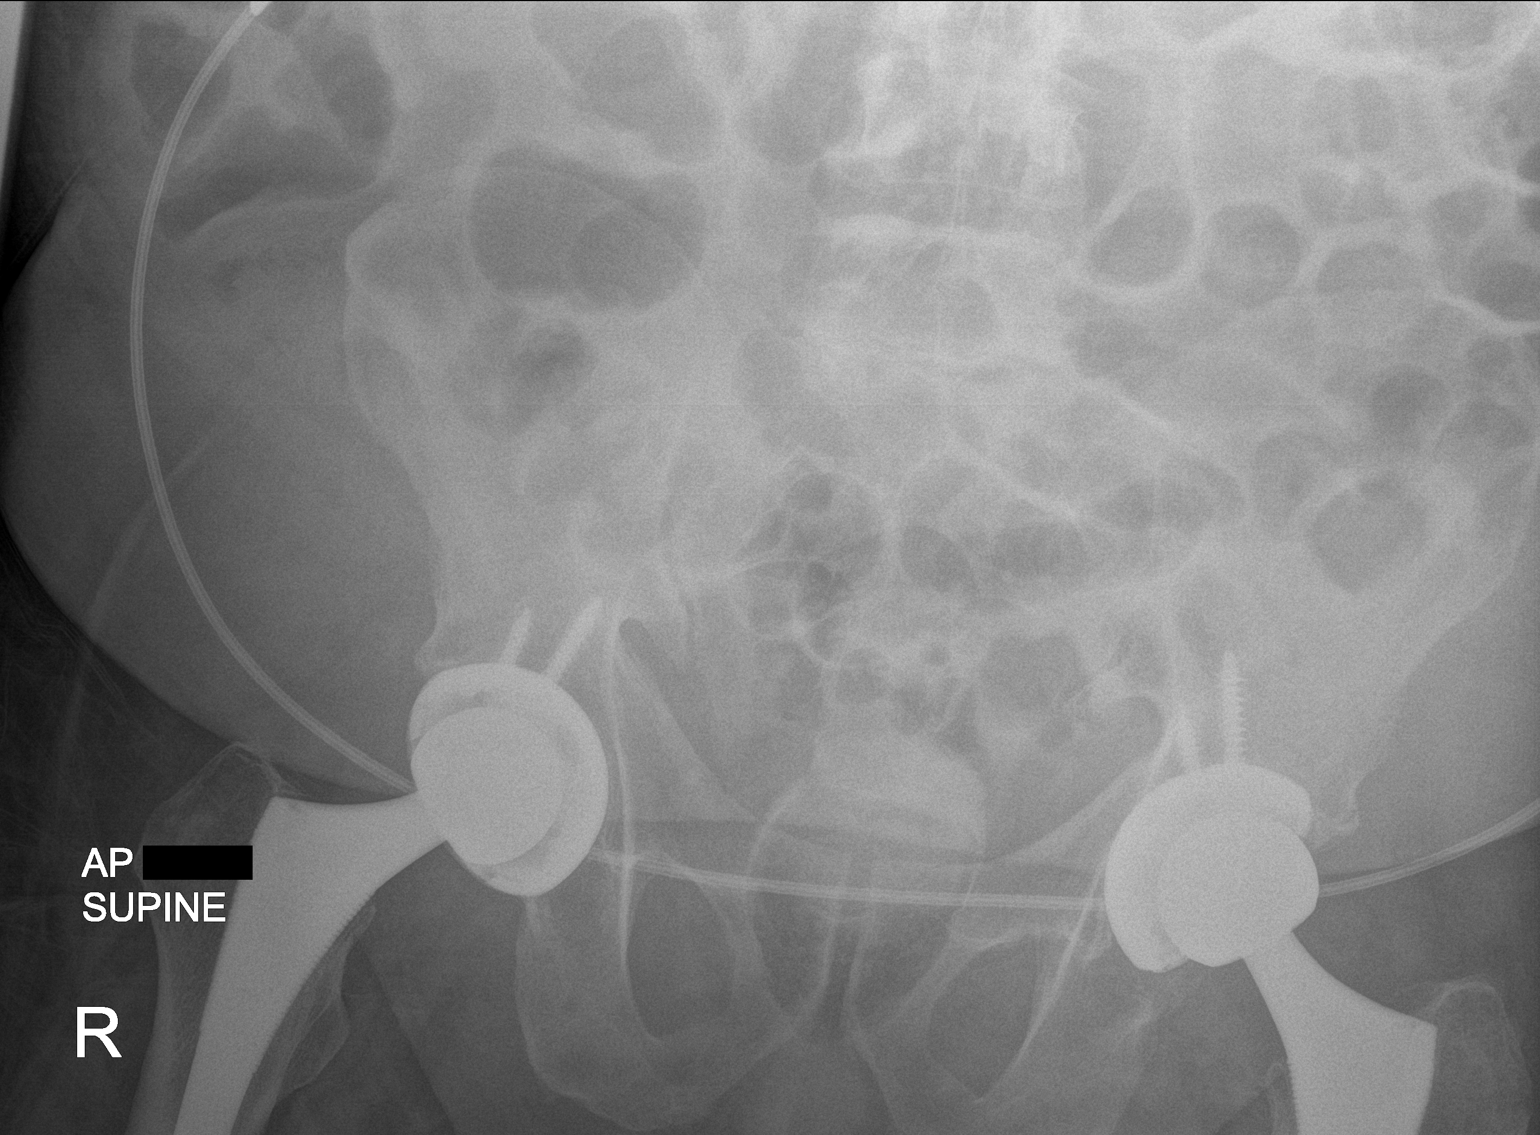

[2 of 2 positions shown; findings below may reference images not displayed]

FINDINGS: Peg tube is identified projected over the stomach. Air is identified
in both colon and small bowel loops. There is no evidence of small
bowel obstruction.
IMPRESSION: Peg tube is identified projected over the stomach. No evidence of
small bowel obstruction.

## 2022-02-02 DEATH — deceased
